# Patient Record
Sex: Female | Born: 1960 | Race: Black or African American | Hispanic: No | Marital: Single | Smoking: Current every day smoker
Health system: Southern US, Community
[De-identification: ages and names within clinical notes are randomized; demographics above are authoritative.]

## PROBLEM LIST (undated history)

## (undated) DIAGNOSIS — R011 Cardiac murmur, unspecified: Secondary | ICD-10-CM

## (undated) DIAGNOSIS — F32A Depression, unspecified: Secondary | ICD-10-CM

## (undated) DIAGNOSIS — M5136 Other intervertebral disc degeneration, lumbar region: Secondary | ICD-10-CM

## (undated) DIAGNOSIS — M51369 Other intervertebral disc degeneration, lumbar region without mention of lumbar back pain or lower extremity pain: Secondary | ICD-10-CM

## (undated) DIAGNOSIS — K259 Gastric ulcer, unspecified as acute or chronic, without hemorrhage or perforation: Secondary | ICD-10-CM

## (undated) DIAGNOSIS — G5601 Carpal tunnel syndrome, right upper limb: Secondary | ICD-10-CM

## (undated) DIAGNOSIS — Z8739 Personal history of other diseases of the musculoskeletal system and connective tissue: Secondary | ICD-10-CM

## (undated) DIAGNOSIS — Z972 Presence of dental prosthetic device (complete) (partial): Secondary | ICD-10-CM

## (undated) DIAGNOSIS — F329 Major depressive disorder, single episode, unspecified: Secondary | ICD-10-CM

## (undated) DIAGNOSIS — M1990 Unspecified osteoarthritis, unspecified site: Secondary | ICD-10-CM

## (undated) DIAGNOSIS — J302 Other seasonal allergic rhinitis: Secondary | ICD-10-CM

## (undated) DIAGNOSIS — F419 Anxiety disorder, unspecified: Secondary | ICD-10-CM

## (undated) DIAGNOSIS — F1911 Other psychoactive substance abuse, in remission: Secondary | ICD-10-CM

## (undated) DIAGNOSIS — K219 Gastro-esophageal reflux disease without esophagitis: Secondary | ICD-10-CM

## (undated) DIAGNOSIS — G40909 Epilepsy, unspecified, not intractable, without status epilepticus: Secondary | ICD-10-CM

## (undated) DIAGNOSIS — Z8719 Personal history of other diseases of the digestive system: Secondary | ICD-10-CM

## (undated) DIAGNOSIS — G589 Mononeuropathy, unspecified: Secondary | ICD-10-CM

## (undated) HISTORY — PX: ABDOMINAL HYSTERECTOMY: SHX81

## (undated) HISTORY — PX: SHOULDER ARTHROSCOPY: SHX128

## (undated) HISTORY — DX: Epilepsy, unspecified, not intractable, without status epilepticus: G40.909

## (undated) HISTORY — PX: EYE SURGERY: SHX253

## (undated) HISTORY — PX: SPINAL FIXATION SURGERY: SHX1055

---

## 1999-09-27 ENCOUNTER — Encounter: Payer: Self-pay | Admitting: Internal Medicine

## 1999-09-27 ENCOUNTER — Ambulatory Visit (HOSPITAL_COMMUNITY): Admission: RE | Admit: 1999-09-27 | Discharge: 1999-09-27 | Payer: Self-pay | Admitting: Internal Medicine

## 2000-10-27 ENCOUNTER — Emergency Department (HOSPITAL_COMMUNITY): Admission: EM | Admit: 2000-10-27 | Discharge: 2000-10-27 | Payer: Self-pay | Admitting: *Deleted

## 2001-01-10 ENCOUNTER — Encounter: Payer: Self-pay | Admitting: Internal Medicine

## 2001-01-10 ENCOUNTER — Encounter: Admission: RE | Admit: 2001-01-10 | Discharge: 2001-01-10 | Payer: Self-pay | Admitting: Internal Medicine

## 2001-11-25 ENCOUNTER — Ambulatory Visit (HOSPITAL_COMMUNITY): Admission: RE | Admit: 2001-11-25 | Discharge: 2001-11-25 | Payer: Self-pay | Admitting: Family Medicine

## 2003-02-20 ENCOUNTER — Ambulatory Visit (HOSPITAL_COMMUNITY): Admission: RE | Admit: 2003-02-20 | Discharge: 2003-02-20 | Payer: Self-pay | Admitting: Internal Medicine

## 2003-02-20 ENCOUNTER — Encounter: Payer: Self-pay | Admitting: Internal Medicine

## 2003-02-27 ENCOUNTER — Ambulatory Visit (HOSPITAL_COMMUNITY): Admission: RE | Admit: 2003-02-27 | Discharge: 2003-02-27 | Payer: Self-pay | Admitting: Family Medicine

## 2004-02-23 ENCOUNTER — Ambulatory Visit (HOSPITAL_COMMUNITY): Admission: RE | Admit: 2004-02-23 | Discharge: 2004-02-23 | Payer: Self-pay | Admitting: Internal Medicine

## 2004-07-27 ENCOUNTER — Ambulatory Visit: Payer: Self-pay | Admitting: *Deleted

## 2004-08-04 ENCOUNTER — Ambulatory Visit: Payer: Self-pay | Admitting: Family Medicine

## 2004-08-31 ENCOUNTER — Ambulatory Visit: Payer: Self-pay | Admitting: Family Medicine

## 2004-09-05 ENCOUNTER — Ambulatory Visit: Payer: Self-pay | Admitting: Family Medicine

## 2006-05-22 ENCOUNTER — Encounter: Admission: RE | Admit: 2006-05-22 | Discharge: 2006-05-22 | Payer: Self-pay | Admitting: Internal Medicine

## 2007-07-25 ENCOUNTER — Emergency Department (HOSPITAL_COMMUNITY): Admission: EM | Admit: 2007-07-25 | Discharge: 2007-07-25 | Payer: Self-pay | Admitting: Emergency Medicine

## 2008-02-21 ENCOUNTER — Ambulatory Visit (HOSPITAL_COMMUNITY): Admission: RE | Admit: 2008-02-21 | Discharge: 2008-02-21 | Payer: Self-pay | Admitting: Internal Medicine

## 2008-11-27 ENCOUNTER — Ambulatory Visit: Admission: RE | Admit: 2008-11-27 | Discharge: 2008-11-27 | Payer: Self-pay | Admitting: Gynecology

## 2008-12-01 ENCOUNTER — Encounter: Payer: Self-pay | Admitting: Obstetrics & Gynecology

## 2008-12-01 ENCOUNTER — Inpatient Hospital Stay (HOSPITAL_COMMUNITY): Admission: RE | Admit: 2008-12-01 | Discharge: 2008-12-03 | Payer: Self-pay | Admitting: Obstetrics & Gynecology

## 2008-12-01 ENCOUNTER — Encounter: Payer: Self-pay | Admitting: Gynecologic Oncology

## 2008-12-01 ENCOUNTER — Ambulatory Visit: Payer: Self-pay | Admitting: Surgery

## 2008-12-01 HISTORY — PX: APPENDECTOMY: SHX54

## 2008-12-01 HISTORY — PX: BILATERAL SALPINGOOPHORECTOMY: SHX1223

## 2009-01-14 ENCOUNTER — Ambulatory Visit: Admission: RE | Admit: 2009-01-14 | Discharge: 2009-01-14 | Payer: Self-pay | Admitting: Gynecologic Oncology

## 2009-03-29 ENCOUNTER — Emergency Department (HOSPITAL_COMMUNITY): Admission: EM | Admit: 2009-03-29 | Discharge: 2009-03-29 | Payer: Self-pay | Admitting: Emergency Medicine

## 2009-05-12 ENCOUNTER — Encounter: Admission: RE | Admit: 2009-05-12 | Discharge: 2009-05-12 | Payer: Self-pay | Admitting: Internal Medicine

## 2009-11-03 ENCOUNTER — Ambulatory Visit (HOSPITAL_COMMUNITY): Admission: RE | Admit: 2009-11-03 | Discharge: 2009-11-03 | Payer: Self-pay | Admitting: Internal Medicine

## 2010-10-13 ENCOUNTER — Other Ambulatory Visit (HOSPITAL_COMMUNITY): Payer: Self-pay | Admitting: Internal Medicine

## 2010-10-13 DIAGNOSIS — Z1231 Encounter for screening mammogram for malignant neoplasm of breast: Secondary | ICD-10-CM

## 2010-11-08 ENCOUNTER — Ambulatory Visit (HOSPITAL_COMMUNITY)
Admission: RE | Admit: 2010-11-08 | Discharge: 2010-11-08 | Disposition: A | Discharge status: Home / Self Care | Payer: BC Managed Care – PPO | Source: Ambulatory Visit | Attending: Internal Medicine | Admitting: Internal Medicine

## 2010-11-08 DIAGNOSIS — Z1231 Encounter for screening mammogram for malignant neoplasm of breast: Secondary | ICD-10-CM

## 2010-12-14 LAB — BASIC METABOLIC PANEL
Chloride: 110 mEq/L (ref 96–112)
GFR calc non Af Amer: >60 mL/min (ref >60)
Sodium: 140 mEq/L (ref 135–145)

## 2010-12-15 LAB — CBC
HCT: 33.2 % — ABNORMAL LOW (ref 36.0–46.0)
HCT: 35.6 % — ABNORMAL LOW (ref 36.0–46.0)
MCHC: 33.5 g/dL (ref 30.0–36.0)
MCHC: 33.9 g/dL (ref 30.0–36.0)
MCV: 99.2 fL (ref 78.0–100.0)
RDW: 14 % (ref 11.5–15.5)
WBC: 5.9 10*3/uL (ref 4.0–10.5)

## 2010-12-15 LAB — BASIC METABOLIC PANEL
BUN: 3 mg/dL — ABNORMAL LOW (ref 6–23)
Calcium: 8 mg/dL — ABNORMAL LOW (ref 8.4–10.5)
GFR calc non Af Amer: >60 mL/min (ref >60)
Sodium: 135 mEq/L (ref 135–145)

## 2010-12-15 LAB — COMPREHENSIVE METABOLIC PANEL
ALT: 12 U/L (ref 0–35)
AST: 15 U/L (ref 0–37)
Albumin: 3.6 g/dL (ref 3.5–5.2)
Alkaline Phosphatase: 64 U/L (ref 39–117)
CO2: 27 mEq/L (ref 19–32)
Calcium: 8.8 mg/dL (ref 8.4–10.5)
GFR calc non Af Amer: >60 mL/min (ref >60)
Glucose, Bld: 95 mg/dL (ref 70–99)
Potassium: 3.7 mEq/L (ref 3.5–5.1)
Sodium: 143 mEq/L (ref 135–145)

## 2010-12-15 LAB — DIFFERENTIAL
Basophils Relative: 1 % (ref 0–1)
Eosinophils Relative: 5 % (ref 0–5)
Lymphocytes Relative: 54 % — ABNORMAL HIGH (ref 12–46)
Lymphs Abs: 1.7 10*3/uL (ref 0.7–4.0)
Monocytes Absolute: 0.2 10*3/uL (ref 0.1–1.0)

## 2011-01-17 NOTE — Op Note (Signed)
NAME:  Pamela [name], Pamela [name] NO.:  [id number]   MEDICAL RECORD NO.:  [id number]          PATIENT TYPE:  INP   LOCATION:  0009                         FACILITY:  [facility]   PHYSICIAN:  Laurette Schimke, MD     DATE OF BIRTH:  21-Jul-1961   DATE OF PROCEDURE:  12/01/2008  DATE OF DISCHARGE:                               OPERATIVE REPORT   PREOPERATIVE DIAGNOSIS:  Pelvic mass.   POSTOPERATIVE DIAGNOSIS:  Right mucinous cystadenoma.   PROCEDURE:  Bilateral salpingo-oophorectomy and appendectomy.   SURGEON:  Laurette Schimke, MD.   ASSISTANT SURGEON:  Roseanna Rainbow, M.D. and Telford Nab,  R.N.   ANESTHESIA:  General endotracheal.   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  A CA-125 returned a value  of 6.1 units.  Other findings were hydronephrosis bilaterally.  The  patient was counseled regarding the need for exploration, bilateral  salpingo-oophorectomy with staging as indicated.   FINDINGS:  Upon entrance into the abdomen a 20 cm adnexal mass, smooth  wall was appreciated.  The contralateral ovary was within normal limits.  The appendix appeared normal.   DESCRIPTION OF PROCEDURE:  The patient was taken to the operating room,  placed under general endotracheal anesthesia without any difficulty.  She was placed in lithotomy position and prepped and draped in the usual  sterile fashion.  A vertical periumbilical incision was made and the  abdominal cavity entered.  Pelvic and abdominal washings were obtained.  A pursestring suture was placed in the presenting portion of the adnexal  mass and a dry towel was placed surrounding.  The cyst was drained of  over 3 L of clear fluid.  The cyst was then delivered into the abdominal  field.  The Bookwalter retractor placed and the contents packed outside  of the pelvis.  The right retroperitoneum space was entered.  The  infundibulopelvic  ligament and ureter were identified.  The  infundibulopelvic ligament was doubly clamped and transected.  The  specimen was dissected free from attachments to the vaginal cuff and  sent for frozen section evaluation.  In a similar manner, the left  retroperitoneum entered.  The ureter was identified, as was the  infundibulopelvic ligament which was clamped and ligated.  The abdomen  was copiously irrigated and drained.  We began closing the incision and  the frozen section returned with diagnosis of mucinous cystadenoma.  As  such it was determined that appendectomy was indicated.  The appendix  appeared normal.  The mesentery of the appendix was clamped and  transfixion suture ligated.  The base of the appendix was resected using  a GIA stapler.  The abdomen was then copiously irrigated and drained and  the fascia closed with #1 loop PDS suture.  The subcutaneous tissues  were copiously irrigated and drained and approximated with interrupted 2-  0 Vicryl.  The skin was closed with subcuticular 4-0 Vicryl suture.   ESTIMATED BLOOD LOSS:  Minimal.   COMPLICATIONS:  None.   SPONGE AND NEEDLE COUNT:  Correct times three.  SPECIMENS:  Ovaries, fallopian tubes and appendix.      Laurette Schimke, MD  Electronically Signed     WB/MEDQ  D:  12/01/2008  T:  12/01/2008  Job:  [id number]   cc:   Janine Limbo, M.D.  Fax: [phone]   Rickard Patience, P.A.  [address]  [place]  [place] [address]   Maretta Bees. Vonita Moss, M.D.  Fax: [phone]   Roseanna Rainbow, M.D.  Fax: [phone]   Telford Nab, R.N.  [address]  [place], [place] [address]

## 2011-01-17 NOTE — Consult Note (Signed)
NAME:  Pamela [name], HELVIE NO.:  [id number]   MEDICAL RECORD NO.:  [id number]          PATIENT TYPE:  OUT   LOCATION:  GYN                          FACILITY:  [facility]   PHYSICIAN:  De Blanch, M.D.DATE OF BIRTH:  01-04-1961   DATE OF CONSULTATION:  11/27/2008  DATE OF DISCHARGE:                                 CONSULTATION   NEW PATIENT CONSULTATION   CHIEF COMPLAINT:  Abdominopelvic mass, urinary frequency.   HISTORY OF PRESENT ILLNESS:  A 50 year old [place] American female seen  for evaluation and management of a large abdominopelvic mass.  The  patient initially presented with urinary frequency, whereupon a large  mass was detected.  A CT scan has been obtained which reveals a 22- x 17-  x 9-cm cystic ovarian neoplasm.  There is mild hydronephrosis  bilaterally secondary to extrinsic compression.  There is no evidence of  ascites or other evidence of metastatic disease.  The patient has had a  CA-125 value which is 6.1 units/mL.  She denies any abdominal pain but  does have pressure and increasing girth.  She also has increasing  gastroesophageal reflux disease.   PAST MEDICAL HISTORY/MEDICAL ILLNESSES:  1. Epilepsy.  (The patient's last seizure was 8 years ago.)  2. Anxiety disorder.  3. Lumbar disk disease.  4. Hiatal hernia.  5. Prior history of drug dependence.  (The patient has taken no drugs      in 15 years.)   CURRENT MEDICATIONS:  1. Effexor 75 mg daily.  2. Tegretol 600 mg b.i.d.  3. Vitamin D.   PAST SURGICAL HISTORY:  1. Hysterectomy for menorrhagia at age 70.  2. Surgery for scoliosis with placement of a Harrington rod.  3. Left shoulder spur.  4. Eye surgery.   DRUG ALLERGIES:  None.   FAMILY HISTORY:  Maternal aunts, 3 of them, with breast cancer, 1  possibly had ovarian cancer.  There is no colon cancer or endometrial  cancer in the family history.   SOCIAL HISTORY:  The patient is single.  She has a partner.  She works  as a Dance movement psychotherapist.  She smokes 1 pack per day for 30 years.  She does  not drink.   OBSTETRIC HISTORY:  Gravida 6 with 2 miscarriages and 4 abortions.   REVIEW OF SYSTEMS:  A 10-point comprehensive review of systems negative  except as noted above.   PHYSICAL EXAMINATION:  Height 5 feet 7, weight 175 pounds.  GENERAL:  The patient is a pleasant African American female in no acute  distress.  HEENT:  Negative.  NECK:  Supple without thyromegaly.  There is no supraclavicular or  inguinal adenopathy.  ABDOMEN:  Soft and non-tender.  There is a soft mass extending slightly  above the umbilicus.  This is nontender.  There is no rebound.  There is  no evidence of ascites.  PELVIC:  EG/BUS, vagina, bladder, urethra are normal.  Cervix and uterus  are surgically absent.  On bimanual exam there is a cystic mass filling  the pelvis and extending to the umbilicus.  This feels cystic and  smooth.  There is no nodularity.  LOWER EXTREMITIES:  Without edema or varicosities.   IMPRESSION:  Large abdominopelvic mass, most likely benign mucinous  cystadenoma.  Patient is symptomatic with a significant amount of pelvic  and abdominal pressure and urinary frequency.  Recommended that she  undergo surgical exploration with removal of the mass.  She would like  to do this as soon as possible and, therefore, we will schedule it to be  performed by my colleague, Dr. Laurette Schimke, next Tuesday.  Patient  understands that if this is benign we would recommend she have the other  ovary removed as well.  On the other hand, if it is malignant then  surgical staging including lymphadenectomy and omentectomy would be  performed.  All the risks were discussed.  Questions were answered.      De Blanch, M.D.  Electronically Signed     DC/MEDQ  D:  11/27/2008  T:  11/27/2008  Job:  [id number]   cc:   Janine Limbo, M.D.  Fax: [phone]   Telford Nab, R.N.  [address]   [place], [place] [address]   Rickard Patience, P.A.  [address]  [place]  [place] [address]   Maretta Bees. Vonita Moss, M.D.  Fax: [phone]

## 2011-01-17 NOTE — Consult Note (Signed)
NAME:  Pamela [name], Pamela [name] NO.:  [id number]   MEDICAL RECORD NO.:  [id number]          PATIENT TYPE:  OUT   LOCATION:  GYN                          [facility]AC[facility]:  [facility]   PHYSICIAN:  Laurette Schimke, MD     DATE OF BIRTH:  1960/12/28   DATE OF CONSULTATION:  01/14/2009  DATE OF DISCHARGE:                                 CONSULTATION   REASON FOR VISIT:  Postoperative evaluation.   HISTORY OF PRESENT ILLNESS:  This is a 50 year old who was noted to have  a pelvic mass which was confirmed by CT scan to be a 22 cm cystic  ovarian neoplasm.  CA125 was within normal limits.  On December 01, 2008,  she underwent bilateral salpingo-oophorectomy and appendectomy.  Pathology was consistent with a mucinous cystadenoma of the right ovary.  The left ovary and fallopian tube were unremarkable.  The appendix was  notable only for evidence of chronic inflammation without any evidence  of malignancy.  Her postoperative course was complicated by a  superficial wound separation.  At this visit, Pamela [name] states that  she has a marked decrease in appetite and a 20 pound weight loss is  appreciated.  She complains of some firmness around the site of the  incision and numbness.  Otherwise, she reports normal bowel movements  and minimal abdominal pain.  Of note, is the unfortunate passing of her  significant other from a myocardial infarction quite unexpectedly.   PHYSICAL EXAMINATION:  VITAL SIGNS:  Weight is 153 pounds, blood  pressure 110/82.  ABDOMEN:  Soft, nontender.  The incision is completely epithelized.  There is firmness at the site of the incision.  No hernia is  appreciated.   IMPRESSION:  Status post bilateral salpingo-oophorectomy and  appendectomy for a right mucinous cystadenoma.  Pamela [name] is doing  well from a post-surgical standpoint.  However emotionally, there is a  significant trauma for which she appears to be coping.   We had a discussion about methods to  increase her caloric intake given  the profound lack of appetite.  She has agreed to follow through.  She  was provided with return to work release with restrictions in heavy  lifting.  No more than 10 pounds for the next 6 weeks.  I have  discharged Pamela [name] back to the care of Henreitta Leber and to [employer].      Laurette Schimke, MD  Electronically Signed     WB/MEDQ  D:  01/14/2009  T:  01/14/2009  Job:  [id number]   cc:   Telford Nab, R.N.  [name]  [place], [place] [address]   Marquis Lunch. Adline Peals.   Velva Harman

## 2011-01-20 NOTE — Discharge Summary (Signed)
NAME:  Pamela [name], Pamela [name] NO.:  [id number]   MEDICAL RECORD NO.:  [id number]          PATIENT TYPE:  INP   LOCATION:  1532                         [facility]ACILITY:  [facility]   PHYSICIAN:  Roseanna Rainbow, M.D.DATE OF BIRTH:  05/01/1961   DATE OF ADMISSION:  12/01/2008  DATE OF DISCHARGE:  12/03/2008                               DISCHARGE SUMMARY   CHIEF COMPLAINT:  The patient is a 50 year old with a large  abdominopelvic mass who presents for further evaluation and management.  Please see the dictated history and physical as per Dr. Katheren Shams-  Sharol Given for further details.   HOSPITAL COURSE:  The patient was admitted and underwent a bilateral  salpingo-oophorectomy and appendectomy.  Please see the dictated  operative summary.  Her postoperative course was uneventful.  Her diet  was gradually advanced.  At the time of discharge, she was tolerating a  regular diet.   DISCHARGE DIAGNOSIS:  Right-sided mucinous cystadenoma.   PROCEDURES:  Bilateral salpingo-oophorectomy and appendectomy.   CONDITION:  Stable.   DIET:  Regular.   ACTIVITY:  Pelvic rest, progressive activity.   MEDICATIONS:  1. Effexor 75 mg p.o. daily.  2. Tegretol 600 mg p.o. b.i.d.  3. Vitamin D.  4. Ultram 1-2 tablets every 6 hours as needed.   DISPOSITION:  The patient was to follow up in the GYN oncology office  for a routine postoperative visit.      Roseanna Rainbow, M.D.  Electronically Signed     LAJ/MEDQ  D:  12/07/2008  T:  12/07/2008  Job:  [id number]   cc:   Janine Limbo, M.D.  Fax: [phone]   Telford Nab, R.N.  [address]  [place], [place] [address]   Rickard Patience, P.A.  [address]  [place]  [place] [address]   Maretta Bees. Vonita Moss, M.D.  Fax: [phone]

## 2011-01-20 NOTE — Consult Note (Signed)
NAME:  Pamela [name], Pamela [name] NO.:  [id number]   MEDICAL RECORD NO.:  [id number]          PATIENT TYPE:  INP   LOCATION:                               FACILITY:  [facility]   PHYSICIAN:  Laurette Schimke, MD     DATE OF BIRTH:  [birth year]   DATE OF CONSULTATION:  12/08/2008  DATE OF DISCHARGE:  12/03/2008                                 CONSULTATION   REASON FOR VISIT:  Postoperative check.   HISTORY OF PRESENT ILLNESS:  This a 50 year old who on CT scan was noted  to have a 22-cm cystic ovarian neoplasm.  Normal CA 125.  On December 01, 2008, she underwent a bilateral salpingo-oophorectomy and appendectomy.  Final pathology was consistent with a right mucinous cystadenoma, a  normal left ovary and fallopian tube, and appendix with mild and chronic  inflammation without evidence of malignancy.  Pamela [name] did well  postoperatively; however, she reports that yesterday she has had an  onset of discharge from the incision.   PHYSICAL EXAMINATION:  ABDOMEN:  Soft and nontender.  There is a  superior and inferior aspect of the incision that is open.  The fascia  was probed with a Q-tip and was noted to be intact.  No fluid pocket or  abscess collection could be appreciated.  Gauze was placed over the  incision.   Pamela [name] has been advised to open the area when she takes a shower.  Otherwise, she has been advised to apply Neosporin and place a 4 x 4  over the openings.  I have asked her to follow up in 2 weeks.      Laurette Schimke, MD  Electronically Signed     WB/MEDQ  D:  12/08/2008  T:  12/08/2008  Job:  [id number]   cc:   Telford Nab, R.N.  [address]  [place], [place] [address]   Janine Limbo, M.D.  Fax: [phone]

## 2011-10-17 ENCOUNTER — Other Ambulatory Visit (HOSPITAL_COMMUNITY): Payer: Self-pay | Admitting: Nurse Practitioner

## 2011-10-17 DIAGNOSIS — Z1231 Encounter for screening mammogram for malignant neoplasm of breast: Secondary | ICD-10-CM

## 2011-11-09 ENCOUNTER — Ambulatory Visit (HOSPITAL_COMMUNITY)
Admission: RE | Admit: 2011-11-09 | Discharge: 2011-11-09 | Disposition: A | Discharge status: Home / Self Care | Payer: BC Managed Care – PPO | Source: Ambulatory Visit | Attending: Nurse Practitioner | Admitting: Nurse Practitioner

## 2011-11-09 DIAGNOSIS — Z1231 Encounter for screening mammogram for malignant neoplasm of breast: Secondary | ICD-10-CM | POA: Insufficient documentation

## 2012-04-16 ENCOUNTER — Telehealth: Payer: Self-pay | Admitting: Hematology and Oncology

## 2012-04-16 NOTE — Telephone Encounter (Signed)
S/w pt re appt for 8/23 @ 9:30 am.

## 2012-04-17 ENCOUNTER — Telehealth: Payer: Self-pay | Admitting: Hematology and Oncology

## 2012-04-17 NOTE — Telephone Encounter (Signed)
Referred by Bebe Liter, FNP Dx- Leukopenia. NP packet mailed out.

## 2012-04-19 ENCOUNTER — Telehealth: Payer: Self-pay | Admitting: Hematology and Oncology

## 2012-04-19 NOTE — Telephone Encounter (Signed)
Delivered chart on 8/16 for a 8/23 appt with Dr. Dalene Carrow.

## 2012-04-26 ENCOUNTER — Ambulatory Visit: Payer: BC Managed Care – PPO | Admitting: Hematology and Oncology

## 2012-04-26 ENCOUNTER — Ambulatory Visit: Payer: BC Managed Care – PPO

## 2012-06-28 ENCOUNTER — Other Ambulatory Visit: Payer: Self-pay | Admitting: Nurse Practitioner

## 2012-06-28 ENCOUNTER — Ambulatory Visit
Admission: RE | Admit: 2012-06-28 | Discharge: 2012-06-28 | Disposition: A | Discharge status: Home / Self Care | Payer: BC Managed Care – PPO | Source: Ambulatory Visit | Attending: Nurse Practitioner | Admitting: Nurse Practitioner

## 2012-06-28 DIAGNOSIS — M79671 Pain in right foot: Secondary | ICD-10-CM

## 2012-10-26 ENCOUNTER — Encounter (HOSPITAL_COMMUNITY): Payer: Self-pay | Admitting: Emergency Medicine

## 2012-10-26 ENCOUNTER — Emergency Department (HOSPITAL_COMMUNITY)
Admission: EM | Admit: 2012-10-26 | Discharge: 2012-10-26 | Disposition: A | Discharge status: Home / Self Care | Payer: BC Managed Care – PPO | Attending: Emergency Medicine | Admitting: Emergency Medicine

## 2012-10-26 ENCOUNTER — Emergency Department (HOSPITAL_COMMUNITY): Payer: BC Managed Care – PPO

## 2012-10-26 DIAGNOSIS — J029 Acute pharyngitis, unspecified: Secondary | ICD-10-CM | POA: Insufficient documentation

## 2012-10-26 DIAGNOSIS — T1490XA Injury, unspecified, initial encounter: Secondary | ICD-10-CM | POA: Insufficient documentation

## 2012-10-26 DIAGNOSIS — Z87891 Personal history of nicotine dependence: Secondary | ICD-10-CM | POA: Insufficient documentation

## 2012-10-26 DIAGNOSIS — Z79899 Other long term (current) drug therapy: Secondary | ICD-10-CM | POA: Insufficient documentation

## 2012-10-26 DIAGNOSIS — R059 Cough, unspecified: Secondary | ICD-10-CM | POA: Insufficient documentation

## 2012-10-26 DIAGNOSIS — R631 Polydipsia: Secondary | ICD-10-CM | POA: Insufficient documentation

## 2012-10-26 DIAGNOSIS — R55 Syncope and collapse: Secondary | ICD-10-CM | POA: Insufficient documentation

## 2012-10-26 DIAGNOSIS — R509 Fever, unspecified: Secondary | ICD-10-CM | POA: Insufficient documentation

## 2012-10-26 DIAGNOSIS — Y929 Unspecified place or not applicable: Secondary | ICD-10-CM | POA: Insufficient documentation

## 2012-10-26 DIAGNOSIS — R296 Repeated falls: Secondary | ICD-10-CM | POA: Insufficient documentation

## 2012-10-26 DIAGNOSIS — Y939 Activity, unspecified: Secondary | ICD-10-CM | POA: Insufficient documentation

## 2012-10-26 DIAGNOSIS — B9789 Other viral agents as the cause of diseases classified elsewhere: Secondary | ICD-10-CM | POA: Insufficient documentation

## 2012-10-26 DIAGNOSIS — R51 Headache: Secondary | ICD-10-CM | POA: Insufficient documentation

## 2012-10-26 DIAGNOSIS — R5381 Other malaise: Secondary | ICD-10-CM | POA: Insufficient documentation

## 2012-10-26 DIAGNOSIS — IMO0001 Reserved for inherently not codable concepts without codable children: Secondary | ICD-10-CM | POA: Insufficient documentation

## 2012-10-26 LAB — BASIC METABOLIC PANEL
BUN: 9 mg/dL (ref 6–23)
Calcium: 8.7 mg/dL (ref 8.4–10.5)
Creatinine, Ser: 0.73 mg/dL (ref 0.50–1.10)
GFR calc non Af Amer: >90 mL/min (ref >90)
Glucose, Bld: 94 mg/dL (ref 70–99)
Sodium: 138 mEq/L (ref 135–145)

## 2012-10-26 LAB — DIFFERENTIAL
Lymphocytes Relative: 36 % (ref 12–46)
Lymphs Abs: 2.2 10*3/uL (ref 0.7–4.0)
Monocytes Absolute: 0.6 10*3/uL (ref 0.1–1.0)
Monocytes Relative: 10 % (ref 3–12)
Neutro Abs: 3 10*3/uL (ref 1.7–7.7)

## 2012-10-26 LAB — HEPATIC FUNCTION PANEL
ALT: 10 U/L (ref 0–35)
AST: 14 U/L (ref 0–37)
Albumin: 3.2 g/dL — ABNORMAL LOW (ref 3.5–5.2)
Bilirubin, Direct: <0.1 mg/dL (ref 0.0–0.3)
Total Bilirubin: 0.3 mg/dL (ref 0.3–1.2)

## 2012-10-26 LAB — CBC
Hemoglobin: 11.6 g/dL — ABNORMAL LOW (ref 12.0–15.0)
MCH: 32.4 pg (ref 26.0–34.0)
MCHC: 34.3 g/dL (ref 30.0–36.0)
MCV: 94.4 fL (ref 78.0–100.0)

## 2012-10-26 LAB — URINE MICROSCOPIC-ADD ON

## 2012-10-26 LAB — URINALYSIS, ROUTINE W REFLEX MICROSCOPIC
Bilirubin Urine: NEGATIVE
Glucose, UA: NEGATIVE mg/dL
Hgb urine dipstick: NEGATIVE
Ketones, ur: NEGATIVE mg/dL
pH: 6 (ref 5.0–8.0)

## 2012-10-26 LAB — CARBAMAZEPINE LEVEL, TOTAL: Carbamazepine Lvl: 14.3 ug/mL — ABNORMAL HIGH (ref 4.0–12.0)

## 2012-10-26 MED ORDER — SODIUM CHLORIDE 0.9 % IV BOLUS (SEPSIS)
2000.0000 mL | Freq: Once | INTRAVENOUS | Status: AC
  Administered 2012-10-26 (×2): 1000 mL via INTRAVENOUS

## 2012-10-26 NOTE — ED Notes (Signed)
Pt states that she passed out twice yesterday.  [facility] 80/50.  They sent her here.  Denies passing out today.  Denies any episodes of this happening today.  Pt has been having a productive cough, loss of appetite x 2 days.  Just finished abx for a sinus infection.

## 2012-10-26 NOTE — ED Provider Notes (Signed)
History     CSN: [id number]  Arrival date & time 10/26/12  1345   First MD Initiated Contact with Patient 10/26/12 1458      Chief Complaint  Patient presents with  . Loss of Consciousness   chief complaint weakness  (Consider location/radiation/quality/duration/timing/severity/associated sxs/prior treatment) HPI  patient reports she had 2 syncopal events yesterday. She was mildly lightheaded before she fell. She admits to diminished appetite for the past therefore days. Feels thirsty presently. Lightheadedness or worse with standing she's also had a cough and sore throat for the past several days, other symptoms include subjective fever and diffuse soreness worse since the fall. She also reports a headache for one month, unchanged. Treated with Tylenol with partial relief of soreness. Nothing makes symptoms better or worse she feels slightly improved today over yesterday History reviewed. No pertinent past medical history. Past medical history seizure disorder anxiety depression, scoliosis, drug abuse History reviewed. No pertinent past surgical history. Surgical history back surgery, hysterectomy History reviewed. No pertinent family history.  History  Substance Use Topics  . Smoking status: Former Games developer  . Smokeless tobacco: Not on file  . Alcohol Use: No   social history ex-smoker quit 3.5 years ago no alcohol in 19 years no illegal drugs in 19 years  OB History   Grav Para Term Preterm Abortions TAB SAB Ect Mult Living                  Review of Systems  HENT: Positive for sore throat.   Respiratory: Positive for cough.   Musculoskeletal: Positive for myalgias.  Neurological: Positive for weakness and headaches.  All other systems reviewed and are negative.    Allergies  Review of patient's allergies indicates no known allergies.  Home Medications   Current Outpatient Rx  Name  Route  Sig  Dispense  Refill  . carbamazepine (TEGRETOL) 100 MG chewable tablet  Oral   Chew 300 mg by mouth 2 (two) times daily.         . magnesium oxide (MAG-OX) 400 MG tablet   Oral   Take 400 mg by mouth daily.         . montelukast (SINGULAIR) 10 MG tablet   Oral   Take 10 mg by mouth at bedtime.         . Multiple Vitamin (MULTIVITAMIN WITH MINERALS) TABS   Oral   Take 1 tablet by mouth daily.         . traZODone (DESYREL) 150 MG tablet   Oral   Take 75 mg by mouth 2 (two) times daily.         Marland Kitchen venlafaxine XR (EFFEXOR-XR) 75 MG 24 hr capsule   Oral   Take 75 mg by mouth every morning.           BP 94/57  Pulse 62  Temp(Src) 98.9 F (37.2 C) (Oral)  Resp 18  SpO2 99%  Physical Exam  Nursing note and vitals reviewed. Constitutional: She is oriented to person, place, and time. She appears well-developed and well-nourished.  HENT:  Head: Normocephalic and atraumatic.  Right Ear: External ear normal.  Left Ear: External ear normal.  Mucous membranes dry oropharynx reddened uvula midline no tonsillar exudate  Eyes: Conjunctivae are normal. Pupils are equal, round, and reactive to light.  Neck: Neck supple. No tracheal deviation present. No thyromegaly present.  Cardiovascular: Normal rate and regular rhythm.   No murmur heard. Pulmonary/Chest: Effort normal and breath sounds normal.  Abdominal: Soft. Bowel sounds are normal. She exhibits no distension. There is no tenderness.  Musculoskeletal: Normal range of motion. She exhibits no edema and no tenderness.  Neurological: She is alert and oriented to person, place, and time. She has normal reflexes. Coordination normal.  Gait normal mildly lightheaded on standing  Skin: Skin is warm and dry. No rash noted.  Psychiatric: She has a normal mood and affect.    ED Course  Procedures (including critical care time)  Labs Reviewed  CBC - Abnormal; Notable for the following:    RBC 3.58 (*)    Hemoglobin 11.6 (*)    HCT 33.8 (*)    Platelets 128 (*)    All other components within  normal limits  GLUCOSE, CAPILLARY  BASIC METABOLIC PANEL   No results found.   No diagnosis found.   Date: 10/26/2012  Rate: 65  Rhythm: normal sinus rhythm  QRS Axis: normal  Intervals: normal  ST/T Wave abnormalities: normal  Conduction Disutrbances: none  Narrative Interpretation: unremarkable 8:15 PM patient feels improved after intravenous hydration and oral hydration. 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 Assessment I feel the patient had syncopal event likely secondary to mild dehydration as she is lightheaded when she stood up and not lightheaded after intravenous hydration. She's also diminished appetite for the past few days. Doubt seizure subtherapeutic Tegretol level Plan Tylenol for pain encourage by mouth intake. Followup Dr. Renae Gloss if not improving in 2 or 3 days Diagnosis #1 syncope #2 dehydration 3 viral illness #4 thrombocytopenia        Doug Sou, MD 10/26/12 2021

## 2013-06-18 ENCOUNTER — Other Ambulatory Visit: Payer: Self-pay

## 2013-06-18 ENCOUNTER — Other Ambulatory Visit: Payer: Self-pay | Admitting: Family

## 2013-06-18 DIAGNOSIS — Z1231 Encounter for screening mammogram for malignant neoplasm of breast: Secondary | ICD-10-CM

## 2013-06-18 DIAGNOSIS — Z803 Family history of malignant neoplasm of breast: Secondary | ICD-10-CM

## 2013-07-15 ENCOUNTER — Ambulatory Visit: Payer: BC Managed Care – PPO

## 2013-08-26 ENCOUNTER — Ambulatory Visit
Admission: RE | Admit: 2013-08-26 | Discharge: 2013-08-26 | Disposition: A | Discharge status: Home / Self Care | Payer: No Typology Code available for payment source | Source: Ambulatory Visit

## 2013-08-26 DIAGNOSIS — Z803 Family history of malignant neoplasm of breast: Secondary | ICD-10-CM

## 2013-08-26 DIAGNOSIS — Z1231 Encounter for screening mammogram for malignant neoplasm of breast: Secondary | ICD-10-CM

## 2013-11-04 ENCOUNTER — Ambulatory Visit
Admission: RE | Admit: 2013-11-04 | Discharge: 2013-11-04 | Disposition: A | Discharge status: Home / Self Care | Payer: No Typology Code available for payment source | Source: Ambulatory Visit | Attending: Family | Admitting: Family

## 2013-11-04 ENCOUNTER — Other Ambulatory Visit: Payer: Self-pay | Admitting: Family

## 2013-11-04 DIAGNOSIS — M79606 Pain in leg, unspecified: Secondary | ICD-10-CM

## 2013-11-26 ENCOUNTER — Ambulatory Visit: Payer: No Typology Code available for payment source | Admitting: Physical Therapy

## 2013-11-27 ENCOUNTER — Ambulatory Visit
Payer: No Typology Code available for payment source | Attending: Obstetrics and Gynecology | Admitting: Physical Therapy

## 2013-11-27 DIAGNOSIS — M545 Low back pain, unspecified: Secondary | ICD-10-CM | POA: Insufficient documentation

## 2013-11-27 DIAGNOSIS — M25569 Pain in unspecified knee: Secondary | ICD-10-CM | POA: Insufficient documentation

## 2013-11-27 DIAGNOSIS — IMO0001 Reserved for inherently not codable concepts without codable children: Secondary | ICD-10-CM | POA: Insufficient documentation

## 2013-11-27 DIAGNOSIS — R262 Difficulty in walking, not elsewhere classified: Secondary | ICD-10-CM | POA: Insufficient documentation

## 2013-11-27 DIAGNOSIS — R269 Unspecified abnormalities of gait and mobility: Secondary | ICD-10-CM | POA: Insufficient documentation

## 2013-11-27 DIAGNOSIS — R293 Abnormal posture: Secondary | ICD-10-CM | POA: Insufficient documentation

## 2013-11-27 DIAGNOSIS — R5381 Other malaise: Secondary | ICD-10-CM | POA: Insufficient documentation

## 2013-12-03 ENCOUNTER — Ambulatory Visit: Payer: No Typology Code available for payment source | Attending: Obstetrics and Gynecology | Admitting: Rehabilitation

## 2013-12-03 DIAGNOSIS — IMO0001 Reserved for inherently not codable concepts without codable children: Secondary | ICD-10-CM | POA: Insufficient documentation

## 2013-12-03 DIAGNOSIS — R293 Abnormal posture: Secondary | ICD-10-CM | POA: Insufficient documentation

## 2013-12-03 DIAGNOSIS — R269 Unspecified abnormalities of gait and mobility: Secondary | ICD-10-CM | POA: Insufficient documentation

## 2013-12-03 DIAGNOSIS — M545 Low back pain, unspecified: Secondary | ICD-10-CM | POA: Insufficient documentation

## 2013-12-03 DIAGNOSIS — R262 Difficulty in walking, not elsewhere classified: Secondary | ICD-10-CM | POA: Insufficient documentation

## 2013-12-03 DIAGNOSIS — M25569 Pain in unspecified knee: Secondary | ICD-10-CM | POA: Insufficient documentation

## 2013-12-03 DIAGNOSIS — R5381 Other malaise: Secondary | ICD-10-CM | POA: Insufficient documentation

## 2013-12-04 ENCOUNTER — Encounter: Payer: No Typology Code available for payment source | Admitting: Rehabilitation

## 2013-12-04 ENCOUNTER — Ambulatory Visit: Payer: No Typology Code available for payment source | Admitting: Rehabilitation

## 2013-12-11 ENCOUNTER — Encounter: Payer: No Typology Code available for payment source | Admitting: Physical Therapy

## 2013-12-15 ENCOUNTER — Encounter: Payer: No Typology Code available for payment source | Admitting: Rehabilitation

## 2013-12-15 ENCOUNTER — Encounter: Payer: Self-pay | Admitting: Physical Medicine & Rehabilitation

## 2013-12-17 ENCOUNTER — Encounter: Payer: No Typology Code available for payment source | Admitting: Rehabilitation

## 2014-01-14 ENCOUNTER — Ambulatory Visit: Payer: No Typology Code available for payment source

## 2014-01-27 ENCOUNTER — Ambulatory Visit: Payer: No Typology Code available for payment source | Admitting: Physical Medicine & Rehabilitation

## 2014-01-28 ENCOUNTER — Ambulatory Visit: Payer: No Typology Code available for payment source | Attending: Obstetrics and Gynecology

## 2014-01-28 DIAGNOSIS — R5381 Other malaise: Secondary | ICD-10-CM | POA: Insufficient documentation

## 2014-01-28 DIAGNOSIS — IMO0001 Reserved for inherently not codable concepts without codable children: Secondary | ICD-10-CM | POA: Insufficient documentation

## 2014-01-28 DIAGNOSIS — M25569 Pain in unspecified knee: Secondary | ICD-10-CM | POA: Insufficient documentation

## 2014-01-28 DIAGNOSIS — M545 Low back pain, unspecified: Secondary | ICD-10-CM | POA: Insufficient documentation

## 2014-01-28 DIAGNOSIS — R269 Unspecified abnormalities of gait and mobility: Secondary | ICD-10-CM | POA: Insufficient documentation

## 2014-01-28 DIAGNOSIS — R293 Abnormal posture: Secondary | ICD-10-CM | POA: Insufficient documentation

## 2014-01-28 DIAGNOSIS — R262 Difficulty in walking, not elsewhere classified: Secondary | ICD-10-CM | POA: Insufficient documentation

## 2014-02-06 ENCOUNTER — Ambulatory Visit: Payer: No Typology Code available for payment source | Attending: Obstetrics and Gynecology

## 2014-02-06 DIAGNOSIS — R5381 Other malaise: Secondary | ICD-10-CM | POA: Insufficient documentation

## 2014-02-06 DIAGNOSIS — M545 Low back pain, unspecified: Secondary | ICD-10-CM | POA: Insufficient documentation

## 2014-02-06 DIAGNOSIS — IMO0001 Reserved for inherently not codable concepts without codable children: Secondary | ICD-10-CM | POA: Insufficient documentation

## 2014-02-06 DIAGNOSIS — R293 Abnormal posture: Secondary | ICD-10-CM | POA: Insufficient documentation

## 2014-02-06 DIAGNOSIS — M25569 Pain in unspecified knee: Secondary | ICD-10-CM | POA: Insufficient documentation

## 2014-02-06 DIAGNOSIS — R269 Unspecified abnormalities of gait and mobility: Secondary | ICD-10-CM | POA: Insufficient documentation

## 2014-02-06 DIAGNOSIS — R262 Difficulty in walking, not elsewhere classified: Secondary | ICD-10-CM | POA: Insufficient documentation

## 2014-02-10 ENCOUNTER — Ambulatory Visit: Payer: No Typology Code available for payment source

## 2014-02-12 ENCOUNTER — Ambulatory Visit: Payer: No Typology Code available for payment source

## 2014-02-17 ENCOUNTER — Ambulatory Visit: Payer: No Typology Code available for payment source

## 2014-02-19 ENCOUNTER — Ambulatory Visit: Payer: No Typology Code available for payment source

## 2014-03-03 ENCOUNTER — Ambulatory Visit: Payer: No Typology Code available for payment source

## 2014-03-05 ENCOUNTER — Ambulatory Visit: Payer: No Typology Code available for payment source | Attending: Obstetrics and Gynecology

## 2014-03-05 DIAGNOSIS — R269 Unspecified abnormalities of gait and mobility: Secondary | ICD-10-CM | POA: Insufficient documentation

## 2014-03-05 DIAGNOSIS — M545 Low back pain, unspecified: Secondary | ICD-10-CM | POA: Insufficient documentation

## 2014-03-05 DIAGNOSIS — R262 Difficulty in walking, not elsewhere classified: Secondary | ICD-10-CM | POA: Insufficient documentation

## 2014-03-05 DIAGNOSIS — M25569 Pain in unspecified knee: Secondary | ICD-10-CM | POA: Insufficient documentation

## 2014-03-05 DIAGNOSIS — R293 Abnormal posture: Secondary | ICD-10-CM | POA: Insufficient documentation

## 2014-03-05 DIAGNOSIS — R5381 Other malaise: Secondary | ICD-10-CM | POA: Insufficient documentation

## 2014-03-05 DIAGNOSIS — IMO0001 Reserved for inherently not codable concepts without codable children: Secondary | ICD-10-CM | POA: Insufficient documentation

## 2014-07-14 ENCOUNTER — Other Ambulatory Visit: Payer: Self-pay | Admitting: Orthopedic Surgery

## 2014-07-21 NOTE — Pre-Procedure Instructions (Signed)
Pamela [name]  07/21/2014   Your procedure is scheduled on:  Wednesday, Nov. 25th   [facility] Admitting at  6:30 AM   Call this number if you have problems the morning of surgery: [phone]   Remember:   Do not eat food or drink liquids after midnight Tuesday.   Take these medicines the morning of surgery with A SIP OF WATER:  Tegretol, Tramadol, Effexor   Do not wear jewelry, make-up or nail polish.  Do not wear lotions, powders, or perfumes. You may NOT wear deodorant the morning of surgery.  Do not shave underarms & legs 48 hours prior to surgery.   Do not bring valuables to the hospital.  [facility] is not responsible for any belongings or valuables.               Contacts, dentures or bridgework may not be worn into surgery.  Leave suitcase in the car. After surgery it may be brought to your room.  For patients admitted to the hospital, discharge time is determined by your treatment team.    Name and phone number of your driver:   Special Instructions: "Preparing for Surgery" instruction sheet.   Please read over the following fact sheets that you were given: Pain Booklet, Coughing and Deep Breathing, Blood Transfusion Information, MRSA Information and Surgical Site Infection Prevention

## 2014-07-22 ENCOUNTER — Encounter (HOSPITAL_COMMUNITY): Payer: Self-pay

## 2014-07-22 ENCOUNTER — Encounter (HOSPITAL_COMMUNITY)
Admission: RE | Admit: 2014-07-22 | Discharge: 2014-07-22 | Disposition: A | Discharge status: Home / Self Care | Payer: No Typology Code available for payment source | Source: Ambulatory Visit | Attending: Orthopedic Surgery | Admitting: Orthopedic Surgery

## 2014-07-22 DIAGNOSIS — Z01818 Encounter for other preprocedural examination: Secondary | ICD-10-CM | POA: Diagnosis present

## 2014-07-22 DIAGNOSIS — K449 Diaphragmatic hernia without obstruction or gangrene: Secondary | ICD-10-CM | POA: Insufficient documentation

## 2014-07-22 DIAGNOSIS — K219 Gastro-esophageal reflux disease without esophagitis: Secondary | ICD-10-CM | POA: Insufficient documentation

## 2014-07-22 DIAGNOSIS — M419 Scoliosis, unspecified: Secondary | ICD-10-CM | POA: Insufficient documentation

## 2014-07-22 DIAGNOSIS — Z981 Arthrodesis status: Secondary | ICD-10-CM | POA: Diagnosis not present

## 2014-07-22 HISTORY — DX: Depression, unspecified: F32.A

## 2014-07-22 HISTORY — DX: Gastro-esophageal reflux disease without esophagitis: K21.9

## 2014-07-22 HISTORY — DX: Major depressive disorder, single episode, unspecified: F32.9

## 2014-07-22 HISTORY — DX: Anxiety disorder, unspecified: F41.9

## 2014-07-22 HISTORY — DX: Personal history of other diseases of the digestive system: Z87.19

## 2014-07-22 HISTORY — DX: Cardiac murmur, unspecified: R01.1

## 2014-07-22 LAB — COMPREHENSIVE METABOLIC PANEL
ALBUMIN: 3.5 g/dL (ref 3.5–5.2)
ALK PHOS: 75 U/L (ref 39–117)
ALT: 10 U/L (ref 0–35)
ANION GAP: 11 (ref 5–15)
AST: 14 U/L (ref 0–37)
BUN: 11 mg/dL (ref 6–23)
CO2: 26 mEq/L (ref 19–32)
Calcium: 9.1 mg/dL (ref 8.4–10.5)
Chloride: 107 mEq/L (ref 96–112)
Creatinine, Ser: 0.68 mg/dL (ref 0.50–1.10)
GFR calc Af Amer: >90 mL/min (ref >90)
GFR calc non Af Amer: >90 mL/min (ref >90)
Glucose, Bld: 82 mg/dL (ref 70–99)
Potassium: 4.1 mEq/L (ref 3.7–5.3)
SODIUM: 144 meq/L (ref 137–147)
Total Bilirubin: <0.2 mg/dL — ABNORMAL LOW (ref 0.3–1.2)
Total Protein: 6.4 g/dL (ref 6.0–8.3)

## 2014-07-22 LAB — CBC WITH DIFFERENTIAL/PLATELET
Basophils Absolute: 0 10*3/uL (ref 0.0–0.1)
Basophils Relative: 1 % (ref 0–1)
Eosinophils Absolute: 0.3 10*3/uL (ref 0.0–0.7)
Eosinophils Relative: 8 % — ABNORMAL HIGH (ref 0–5)
HCT: 34.7 % — ABNORMAL LOW (ref 36.0–46.0)
Hemoglobin: 11.5 g/dL — ABNORMAL LOW (ref 12.0–15.0)
Lymphocytes Relative: 53 % — ABNORMAL HIGH (ref 12–46)
Lymphs Abs: 1.6 10*3/uL (ref 0.7–4.0)
MCH: 31.6 pg (ref 26.0–34.0)
MCHC: 33.1 g/dL (ref 30.0–36.0)
MCV: 95.3 fL (ref 78.0–100.0)
MONO ABS: 0.3 10*3/uL (ref 0.1–1.0)
Monocytes Relative: 8 % (ref 3–12)
NEUTROS PCT: 30 % — AB (ref 43–77)
Neutro Abs: 1 10*3/uL — ABNORMAL LOW (ref 1.7–7.7)
PLATELETS: 169 10*3/uL (ref 150–400)
RBC: 3.64 MIL/uL — ABNORMAL LOW (ref 3.87–5.11)
RDW: 13.7 % (ref 11.5–15.5)
SMEAR REVIEW: ADEQUATE
WBC: 3.2 10*3/uL — ABNORMAL LOW (ref 4.0–10.5)

## 2014-07-22 LAB — URINALYSIS, ROUTINE W REFLEX MICROSCOPIC
Bilirubin Urine: NEGATIVE
GLUCOSE, UA: NEGATIVE mg/dL
HGB URINE DIPSTICK: NEGATIVE
Ketones, ur: NEGATIVE mg/dL
Nitrite: NEGATIVE
PROTEIN: NEGATIVE mg/dL
SPECIFIC GRAVITY, URINE: 1.02 (ref 1.005–1.030)
Urobilinogen, UA: 0.2 mg/dL (ref 0.0–1.0)
pH: 6.5 (ref 5.0–8.0)

## 2014-07-22 LAB — SURGICAL PCR SCREEN
MRSA, PCR: NEGATIVE
STAPHYLOCOCCUS AUREUS: NEGATIVE

## 2014-07-22 LAB — PROTIME-INR
INR: 0.99 (ref 0.00–1.49)
Prothrombin Time: 13.2 seconds (ref 11.6–15.2)

## 2014-07-22 LAB — URINE MICROSCOPIC-ADD ON

## 2014-07-22 LAB — APTT: aPTT: 31 seconds (ref 24–37)

## 2014-07-22 NOTE — Pre-Procedure Instructions (Signed)
Pamela [name]  07/22/2014   Your procedure is scheduled on:  Wednesday, July 29, 2014  [facility] Entrance "A" [address] at [employer]6:30 AM.  Call this number if you have problems the morning of surgery: [phone]   Remember:   Do not eat food or drink liquids after midnight.   Take these medicines the morning of surgery with A SIP OF WATER: TYLENOL (IF NEEDED), TEGRETOL, ULTRAM, TRAZODONE (DYSYREL), VENLAFAXINE (EFFEXOR-XR)  STOP taking Aspirin, Goody's, BC's, Aleve (Naproxen), Ibuprofen (Advil or Motrin), Fish Oil, Vitamins, Herbal Supplements or any substance that could thin your blood starting today, Wednesday, July 22, 2014.  Continue to take all your other medicines as you normally do until day of surgery and then follow above instructions.   Do not wear jewelry, make-up or nail polish.  Do not wear lotions, powders, or perfumes. You may wear deodorant.  Do not shave 48 hours prior to surgery.   Do not bring valuables to the hospital.  [facility] is not responsible                  for any belongings or valuables.               Contacts, dentures or bridgework may not be worn into surgery.  Leave suitcase in the car. After surgery it may be brought to your room.  For patients admitted to the hospital, discharge time is determined by your                treatment team.               Patients discharged the day of surgery will not be allowed to drive  home.    Special Instructions: Please use CHG soap the night before surgery and the day of surgery. CHG soap should be used atleast twice.   Please read over the following fact sheets that you were given: Pain Booklet, Coughing and Deep Breathing, Blood Transfusion Information, MRSA Information and Surgical Site Infection Prevention

## 2014-07-22 NOTE — Progress Notes (Signed)
Per Blood Bank, a mismatch of blood bracelets occurred and sample has to be recollected on day of surgery.

## 2014-07-22 NOTE — Progress Notes (Signed)
Pt reports last office visit was about 2 weeks ago (saw [place], [place] with Dr. Allyne GeeSanders) at [facility] for a cold and cough. Pt denied fever. Pt reports that she completed oral antibiotics and denies cough or cold symptoms at present. Pt denied having blood drawn or CXR at PCP office.  Pt denies seeing a Cardiologist and reports that ECHO and stress test were done over 20 years ago. Pt reports last chest pain was about 5 years ago when she was diagnosed with GERD and having a hiatal hernia.

## 2014-07-24 LAB — PATHOLOGIST SMEAR REVIEW

## 2014-07-28 MED ORDER — POVIDONE-IODINE 7.5 % EX SOLN
Freq: Once | CUTANEOUS | Status: DC
  Filled 2014-07-28: qty 118

## 2014-07-28 MED ORDER — CEFAZOLIN SODIUM-DEXTROSE 2-3 GM-% IV SOLR
2.0000 g | INTRAVENOUS | Status: AC
  Administered 2014-07-29: 2 g via INTRAVENOUS
  Filled 2014-07-28: qty 50

## 2014-07-29 ENCOUNTER — Ambulatory Visit (HOSPITAL_COMMUNITY): Payer: No Typology Code available for payment source | Admitting: Certified Registered Nurse Anesthetist

## 2014-07-29 ENCOUNTER — Encounter (HOSPITAL_COMMUNITY)
Admission: RE | Disposition: A | Discharge status: Home / Self Care | Payer: Self-pay | Source: Ambulatory Visit | Attending: Orthopedic Surgery

## 2014-07-29 ENCOUNTER — Ambulatory Visit (HOSPITAL_COMMUNITY)
Admission: RE | Admit: 2014-07-29 | Discharge: 2014-07-29 | Disposition: A | Discharge status: Home / Self Care | Payer: No Typology Code available for payment source | Source: Ambulatory Visit | Attending: Orthopedic Surgery | Admitting: Orthopedic Surgery

## 2014-07-29 ENCOUNTER — Ambulatory Visit (HOSPITAL_COMMUNITY): Payer: No Typology Code available for payment source

## 2014-07-29 ENCOUNTER — Encounter (HOSPITAL_COMMUNITY): Payer: Self-pay | Admitting: *Deleted

## 2014-07-29 DIAGNOSIS — Z87891 Personal history of nicotine dependence: Secondary | ICD-10-CM | POA: Insufficient documentation

## 2014-07-29 DIAGNOSIS — M79604 Pain in right leg: Secondary | ICD-10-CM | POA: Diagnosis present

## 2014-07-29 DIAGNOSIS — K219 Gastro-esophageal reflux disease without esophagitis: Secondary | ICD-10-CM | POA: Insufficient documentation

## 2014-07-29 DIAGNOSIS — G40909 Epilepsy, unspecified, not intractable, without status epilepticus: Secondary | ICD-10-CM | POA: Diagnosis not present

## 2014-07-29 DIAGNOSIS — Z419 Encounter for procedure for purposes other than remedying health state, unspecified: Secondary | ICD-10-CM

## 2014-07-29 DIAGNOSIS — Z79891 Long term (current) use of opiate analgesic: Secondary | ICD-10-CM | POA: Insufficient documentation

## 2014-07-29 DIAGNOSIS — F419 Anxiety disorder, unspecified: Secondary | ICD-10-CM | POA: Insufficient documentation

## 2014-07-29 DIAGNOSIS — F329 Major depressive disorder, single episode, unspecified: Secondary | ICD-10-CM | POA: Insufficient documentation

## 2014-07-29 DIAGNOSIS — M5116 Intervertebral disc disorders with radiculopathy, lumbar region: Secondary | ICD-10-CM | POA: Insufficient documentation

## 2014-07-29 HISTORY — PX: LUMBAR LAMINECTOMY/DECOMPRESSION MICRODISCECTOMY: SHX5026

## 2014-07-29 LAB — TYPE AND SCREEN
ABO/RH(D): O POS
ANTIBODY SCREEN: NEGATIVE

## 2014-07-29 LAB — ABO/RH: ABO/RH(D): O POS

## 2014-07-29 SURGERY — LUMBAR LAMINECTOMY/DECOMPRESSION MICRODISCECTOMY
Anesthesia: General | Site: Back

## 2014-07-29 MED ORDER — PROPOFOL 10 MG/ML IV BOLUS
INTRAVENOUS | Status: AC
  Filled 2014-07-29: qty 20

## 2014-07-29 MED ORDER — ONDANSETRON HCL 4 MG/2ML IJ SOLN
INTRAMUSCULAR | Status: DC | PRN
  Administered 2014-07-29: 4 mg via INTRAVENOUS

## 2014-07-29 MED ORDER — LIDOCAINE HCL (CARDIAC) 20 MG/ML IV SOLN
INTRAVENOUS | Status: AC
  Filled 2014-07-29: qty 5

## 2014-07-29 MED ORDER — HYDROMORPHONE HCL 1 MG/ML IJ SOLN
0.2500 mg | INTRAMUSCULAR | Status: DC | PRN
  Administered 2014-07-29 (×2): 0.5 mg via INTRAVENOUS

## 2014-07-29 MED ORDER — THROMBIN 20000 UNITS EX SOLR
CUTANEOUS | Status: DC | PRN
  Administered 2014-07-29: 20 mL via TOPICAL

## 2014-07-29 MED ORDER — HYDROMORPHONE HCL 1 MG/ML IJ SOLN
INTRAMUSCULAR | Status: AC
  Filled 2014-07-29: qty 1

## 2014-07-29 MED ORDER — SUCCINYLCHOLINE CHLORIDE 20 MG/ML IJ SOLN
INTRAMUSCULAR | Status: AC
  Filled 2014-07-29: qty 1

## 2014-07-29 MED ORDER — PROPOFOL 10 MG/ML IV BOLUS
INTRAVENOUS | Status: DC | PRN
  Administered 2014-07-29: 150 mg via INTRAVENOUS

## 2014-07-29 MED ORDER — METHYLENE BLUE 1 % INJ SOLN
INTRAMUSCULAR | Status: AC
  Filled 2014-07-29: qty 10

## 2014-07-29 MED ORDER — BUPIVACAINE-EPINEPHRINE 0.25% -1:200000 IJ SOLN
INTRAMUSCULAR | Status: DC | PRN
Start: 2014-07-29 — End: 2014-07-29
  Administered 2014-07-29: 18 mL

## 2014-07-29 MED ORDER — FENTANYL CITRATE 0.05 MG/ML IJ SOLN
INTRAMUSCULAR | Status: AC
  Filled 2014-07-29: qty 5

## 2014-07-29 MED ORDER — THROMBIN 20000 UNITS EX SOLR
CUTANEOUS | Status: AC
  Filled 2014-07-29: qty 20000

## 2014-07-29 MED ORDER — ROCURONIUM BROMIDE 50 MG/5ML IV SOLN
INTRAVENOUS | Status: AC
  Filled 2014-07-29: qty 1

## 2014-07-29 MED ORDER — LIDOCAINE HCL (CARDIAC) 20 MG/ML IV SOLN
INTRAVENOUS | Status: DC | PRN
  Administered 2014-07-29: 60 mg via INTRAVENOUS

## 2014-07-29 MED ORDER — FENTANYL CITRATE 0.05 MG/ML IJ SOLN
INTRAMUSCULAR | Status: DC | PRN
  Administered 2014-07-29: 100 ug via INTRAVENOUS
  Administered 2014-07-29 (×2): 25 ug via INTRAVENOUS
  Administered 2014-07-29: 50 ug via INTRAVENOUS

## 2014-07-29 MED ORDER — LACTATED RINGERS IV SOLN
INTRAVENOUS | Status: DC | PRN
  Administered 2014-07-29 (×3): via INTRAVENOUS

## 2014-07-29 MED ORDER — METHYLPREDNISOLONE ACETATE 40 MG/ML IJ SUSP
INTRAMUSCULAR | Status: DC | PRN
  Administered 2014-07-29: 1 mL

## 2014-07-29 MED ORDER — EPHEDRINE SULFATE 50 MG/ML IJ SOLN
INTRAMUSCULAR | Status: AC
  Filled 2014-07-29: qty 1

## 2014-07-29 MED ORDER — PROPOFOL INFUSION 10 MG/ML OPTIME
INTRAVENOUS | Status: DC | PRN
  Administered 2014-07-29: 50 ug/kg/min via INTRAVENOUS

## 2014-07-29 MED ORDER — BUPIVACAINE-EPINEPHRINE (PF) 0.25% -1:200000 IJ SOLN
INTRAMUSCULAR | Status: AC
  Filled 2014-07-29: qty 30

## 2014-07-29 MED ORDER — SUCCINYLCHOLINE CHLORIDE 20 MG/ML IJ SOLN
INTRAMUSCULAR | Status: DC | PRN
  Administered 2014-07-29: 100 mg via INTRAVENOUS

## 2014-07-29 MED ORDER — SODIUM CHLORIDE 0.9 % IJ SOLN
INTRAMUSCULAR | Status: AC
  Filled 2014-07-29: qty 10

## 2014-07-29 MED ORDER — ROCURONIUM BROMIDE 100 MG/10ML IV SOLN
INTRAVENOUS | Status: DC | PRN
  Administered 2014-07-29: 20 mg via INTRAVENOUS

## 2014-07-29 MED ORDER — PHENYLEPHRINE 40 MCG/ML (10ML) SYRINGE FOR IV PUSH (FOR BLOOD PRESSURE SUPPORT)
PREFILLED_SYRINGE | INTRAVENOUS | Status: AC
  Filled 2014-07-29: qty 10

## 2014-07-29 MED ORDER — ONDANSETRON HCL 4 MG/2ML IJ SOLN
INTRAMUSCULAR | Status: AC
  Filled 2014-07-29: qty 2

## 2014-07-29 MED ORDER — MIDAZOLAM HCL 2 MG/2ML IJ SOLN
INTRAMUSCULAR | Status: AC
  Filled 2014-07-29: qty 2

## 2014-07-29 MED ORDER — METHYLPREDNISOLONE ACETATE 40 MG/ML IJ SUSP
INTRAMUSCULAR | Status: AC
  Filled 2014-07-29: qty 1

## 2014-07-29 MED ORDER — 0.9 % SODIUM CHLORIDE (POUR BTL) OPTIME
TOPICAL | Status: DC | PRN
  Administered 2014-07-29: 1000 mL

## 2014-07-29 MED ORDER — METHYLENE BLUE 1 % INJ SOLN
INTRAMUSCULAR | Status: DC | PRN
  Administered 2014-07-29: 1 mL

## 2014-07-29 MED ORDER — MIDAZOLAM HCL 5 MG/5ML IJ SOLN
INTRAMUSCULAR | Status: DC | PRN
  Administered 2014-07-29: 1 mg via INTRAVENOUS

## 2014-07-29 MED ORDER — EPHEDRINE SULFATE 50 MG/ML IJ SOLN
INTRAMUSCULAR | Status: DC | PRN
  Administered 2014-07-29: 5 mg via INTRAVENOUS
  Administered 2014-07-29: 15 mg via INTRAVENOUS
  Administered 2014-07-29: 5 mg via INTRAVENOUS
  Administered 2014-07-29: 25 mg via INTRAVENOUS

## 2014-07-29 SURGICAL SUPPLY — 75 items
APL SKNCLS STERI-STRIP NONHPOA (GAUZE/BANDAGES/DRESSINGS) ×1
BENZOIN TINCTURE PRP APPL 2/3 (GAUZE/BANDAGES/DRESSINGS) ×3 IMPLANT
BUR ROUND PRECISION 4.0 (BURR) ×2 IMPLANT
BUR ROUND PRECISION 4.0MM (BURR) ×1
CANISTER SUCTION 2500CC (MISCELLANEOUS) ×3 IMPLANT
CARTRIDGE OIL MAESTRO DRILL (MISCELLANEOUS) ×1 IMPLANT
CLOSURE WOUND 1/2 X4 (GAUZE/BANDAGES/DRESSINGS) ×1
CORDS BIPOLAR (ELECTRODE) ×3 IMPLANT
COVER SURGICAL LIGHT HANDLE (MISCELLANEOUS) ×3 IMPLANT
DIFFUSER DRILL AIR PNEUMATIC (MISCELLANEOUS) ×3 IMPLANT
DRAIN CHANNEL 15F RND FF W/TCR (WOUND CARE) IMPLANT
DRAPE POUCH INSTRU U-SHP 10X18 (DRAPES) ×6 IMPLANT
DRAPE SURG 17X23 STRL (DRAPES) ×12 IMPLANT
DURAPREP 26ML APPLICATOR (WOUND CARE) ×3 IMPLANT
ELECT BLADE 4.0 EZ CLEAN MEGAD (MISCELLANEOUS)
ELECT CAUTERY BLADE 6.4 (BLADE) ×3 IMPLANT
ELECT REM PT RETURN 9FT ADLT (ELECTROSURGICAL) ×3
ELECTRODE BLDE 4.0 EZ CLN MEGD (MISCELLANEOUS) IMPLANT
ELECTRODE REM PT RTRN 9FT ADLT (ELECTROSURGICAL) ×1 IMPLANT
EVACUATOR SILICONE 100CC (DRAIN) IMPLANT
FILTER STRAW FLUID ASPIR (MISCELLANEOUS) ×3 IMPLANT
GAUZE SPONGE 4X4 12PLY STRL (GAUZE/BANDAGES/DRESSINGS) ×3 IMPLANT
GAUZE SPONGE 4X4 16PLY XRAY LF (GAUZE/BANDAGES/DRESSINGS) ×3 IMPLANT
GLOVE BIO SURGEON STRL SZ7 (GLOVE) IMPLANT
GLOVE BIO SURGEON STRL SZ8 (GLOVE) ×3 IMPLANT
GLOVE BIOGEL PI IND STRL 6.5 (GLOVE) ×1 IMPLANT
GLOVE BIOGEL PI IND STRL 7.0 (GLOVE) IMPLANT
GLOVE BIOGEL PI IND STRL 8 (GLOVE) ×1 IMPLANT
GLOVE BIOGEL PI INDICATOR 6.5 (GLOVE) ×2
GLOVE BIOGEL PI INDICATOR 7.0 (GLOVE)
GLOVE BIOGEL PI INDICATOR 8 (GLOVE) ×2
GLOVE ECLIPSE 6.5 STRL STRAW (GLOVE) ×3 IMPLANT
GOWN STRL REUS W/ TWL LRG LVL3 (GOWN DISPOSABLE) ×2 IMPLANT
GOWN STRL REUS W/ TWL XL LVL3 (GOWN DISPOSABLE) ×2 IMPLANT
GOWN STRL REUS W/TWL LRG LVL3 (GOWN DISPOSABLE) ×6
GOWN STRL REUS W/TWL XL LVL3 (GOWN DISPOSABLE) ×6
IV CATH 14GX2 1/4 (CATHETERS) ×3 IMPLANT
KIT BASIN OR (CUSTOM PROCEDURE TRAY) ×3 IMPLANT
KIT POSITION SURG JACKSON T1 (MISCELLANEOUS) ×3 IMPLANT
KIT ROOM TURNOVER OR (KITS) ×3 IMPLANT
NEEDLE 18GX1X1/2 (RX/OR ONLY) (NEEDLE) ×3 IMPLANT
NEEDLE 22X1 1/2 (OR ONLY) (NEEDLE) ×3 IMPLANT
NEEDLE HYPO 25GX1X1/2 BEV (NEEDLE) ×3 IMPLANT
NEEDLE SPNL 18GX3.5 QUINCKE PK (NEEDLE) ×9 IMPLANT
NEURO MONITORING STIM (LABOR (TRAVEL & OVERTIME)) ×3 IMPLANT
NS IRRIG 1000ML POUR BTL (IV SOLUTION) ×3 IMPLANT
OIL CARTRIDGE MAESTRO DRILL (MISCELLANEOUS) ×3
PACK LAMINECTOMY ORTHO (CUSTOM PROCEDURE TRAY) ×3 IMPLANT
PACK UNIVERSAL I (CUSTOM PROCEDURE TRAY) ×3 IMPLANT
PAD ARMBOARD 7.5X6 YLW CONV (MISCELLANEOUS) ×6 IMPLANT
PATTIES SURGICAL .5 X.5 (GAUZE/BANDAGES/DRESSINGS) IMPLANT
PATTIES SURGICAL .5 X1 (DISPOSABLE) ×3 IMPLANT
SPONGE INTESTINAL PEANUT (DISPOSABLE) ×3 IMPLANT
SPONGE SURGIFOAM ABS GEL 100 (HEMOSTASIS) ×3 IMPLANT
SPONGE SURGIFOAM ABS GEL SZ50 (HEMOSTASIS) ×3 IMPLANT
STRIP CLOSURE SKIN 1/2X4 (GAUZE/BANDAGES/DRESSINGS) ×2 IMPLANT
SURGIFLO TRUKIT (HEMOSTASIS) IMPLANT
SUT MNCRL AB 4-0 PS2 18 (SUTURE) ×3 IMPLANT
SUT VIC AB 0 CT1 18XCR BRD 8 (SUTURE) IMPLANT
SUT VIC AB 0 CT1 27 (SUTURE)
SUT VIC AB 0 CT1 27XBRD ANBCTR (SUTURE) IMPLANT
SUT VIC AB 0 CT1 8-18 (SUTURE)
SUT VIC AB 1 CT1 18XCR BRD 8 (SUTURE) ×2 IMPLANT
SUT VIC AB 1 CT1 8-18 (SUTURE) ×6
SUT VIC AB 2-0 CT2 18 VCP726D (SUTURE) ×3 IMPLANT
SYR 20CC LL (SYRINGE) IMPLANT
SYR BULB IRRIGATION 50ML (SYRINGE) ×3 IMPLANT
SYR CONTROL 10ML LL (SYRINGE) ×6 IMPLANT
SYR TB 1ML 26GX3/8 SAFETY (SYRINGE) ×6 IMPLANT
SYR TB 1ML LUER SLIP (SYRINGE) ×3 IMPLANT
TOWEL OR 17X24 6PK STRL BLUE (TOWEL DISPOSABLE) ×3 IMPLANT
TOWEL OR 17X26 10 PK STRL BLUE (TOWEL DISPOSABLE) ×3 IMPLANT
TRAY FOLEY CATH 16FRSI W/METER (SET/KITS/TRAYS/PACK) ×3 IMPLANT
WATER STERILE IRR 1000ML POUR (IV SOLUTION) IMPLANT
YANKAUER SUCT BULB TIP NO VENT (SUCTIONS) ×3 IMPLANT

## 2014-07-29 NOTE — Op Note (Signed)
NAME:  Pamela [name], Pamela [name]         ACCOUNT NO.:  [id number]  MEDICAL RECORD NO.:  [id number]  LOCATION:  [facility]                         FACILITY:  [facility]  PHYSICIAN:  Estill BambergMark Aadvik Roker, MD      DATE OF BIRTH:  14-Dec-1960  DATE OF PROCEDURE:  07/29/2014                              OPERATIVE REPORT   PREOPERATIVE DIAGNOSES: 1. 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 2. Right L4 radiculopathy.  POSTOPERATIVE DIAGNOSES: 1. 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 2. Right L4 radiculopathy.  PROCEDURE:  Transpedicular right-sided L4-5 decompression using a Wiltse approach with removal of a large extruded L4-5 disk fragment.  SURGEON:  Estill BambergMark Jshaun Abernathy, MD  ASSISTANT:  Jason CoopKayla McKenzie, PA-C  ANESTHESIA:  General endotracheal anesthesia.  COMPLICATIONS:  None.  DISPOSITION:  Stable.  ESTIMATED BLOOD LOSS:  Minimal.  INDICATIONS FOR SURGERY:  Briefly, [name] is a pleasant 53 year old female who did present to me with a 1 year history of ongoing pain in her right leg.  An MRI did clearly reveal a large foraminal/extra foraminal right-sided L4-5 disk herniation, clearly causing compression of the exiting L4 nerve on the right.  The patient did have multiple forms of conservative care, including epidural injections, but she did continue to have ongoing pain.  We therefore did discuss proceeding with the procedure noted above.  The patient did understand that given the duration of her symptoms and the location of her disk herniation, that she may have residual symptoms after surgery.  OPERATIVE DETAILS:  On July 29, 2014, the patient was brought to surgery and general endotracheal anesthesia was administered.  The patient was placed prone on a well-padded flat Jackson bed with spinal frame.  Antibiotics were given and a time-out procedure was performed. A right-sided paramedian incision was made, 1 cm lateral to the midline on the  right side.  The fascia was incised.  Using a Wiltse approach, I was able to identify the posterolateral gutter on the right, as well as the lateral aspect of the pars interarticularis of L4.  A self-retaining retractor was placed, and a lateral intraoperative radiograph did confirm the appropriate operative level.  I then placed a self-retaining McCulloch retractor.  I then removed the intertransverse membrane in the lateral aspect of the L4-5 facet joint.  I was able to identify the exiting L3 nerve, which was clearly noted to be under substantial tension and pressure.  There were substantial adhesions between the exiting L3 nerve and the intervertebral disk fragment that was compressing it.  I was however able to release the adhesions, and I was able to superiorly and laterally retract nerves.  A very large disk fragment was readily identified and removed in multiple fragments.  In doing so, I was able to adequately decompress the exiting L3 nerve.  I was very pleased with the decompression that I was able to accomplish. The wound was copiously irrigated.  The epidural bleeding was controlled using bipolar electrocautery and in addition a Gelfoam.  A 40 mg of Depo- Medrol was introduced about the foraminal/extraforaminal region and the region of the L3 nerve on the right.  Of note, neurologic monitoring was used throughout the surgery, and there was no abnormal  EMG activity encountered throughout the entire surgery.  The fascia was then closed using #1 Vicryl.  The skin was then closed in layers using 0 Vicryl, followed by 2-0 Vicryl, followed by 3-0 Monocryl.  Benzoin and Steri- Strips were applied, followed by a sterile dressing.  All instrument counts were correct at the termination of the procedure.  Of note, Jason CoopKayla McKenzie was my assistant throughout the surgery, and did aid in retraction, suctioning, and closure.     Estill BambergMark Saatvik Thielman, MD     MD/MEDQ  D:  07/29/2014  T:   07/29/2014  Job:  [id number]

## 2014-07-29 NOTE — Discharge Instructions (Signed)

## 2014-07-29 NOTE — Anesthesia Preprocedure Evaluation (Addendum)
Anesthesia Evaluation  Patient identified by MRN, date of birth, ID band Patient awake    Reviewed: Allergy & Precautions, H&P , NPO status , Patient's Chart, lab work & pertinent test results  Airway Mallampati: II  TM Distance: >3 FB Neck ROM: Full    Dental  (+) Teeth Intact, Dental Advisory Given, Partial Upper, Partial Lower   Pulmonary shortness of breath and with exertion, pneumonia -, former smoker,  breath sounds clear to auscultation        Cardiovascular Rhythm:Regular Rate:Normal  History of heat murmur w/u neg as per patient. CE   Neuro/Psych Seizures -, Well Controlled,  Anxiety Depression    GI/Hepatic Neg liver ROS, hiatal hernia, GERD-  Medicated and Controlled,  Endo/Other  negative endocrine ROS  Renal/GU negative Renal ROS     Musculoskeletal   Abdominal   Peds  Hematology   Anesthesia Other Findings   Reproductive/Obstetrics                           Anesthesia Physical Anesthesia Plan  ASA: III  Anesthesia Plan: General   Post-op Pain Management:    Induction: Intravenous  Airway Management Planned: Oral ETT  Additional Equipment:   Intra-op Plan:   Post-operative Plan: Extubation in OR  Informed Consent: I have reviewed the patients History and Physical, chart, labs and discussed the procedure including the risks, benefits and alternatives for the proposed anesthesia with the patient or authorized representative who has indicated his/her understanding and acceptance.   Dental advisory given  Plan Discussed with: CRNA and Anesthesiologist  Anesthesia Plan Comments:        Anesthesia Quick Evaluation

## 2014-07-29 NOTE — Transfer of Care (Signed)
Immediate Anesthesia Transfer of Care Note  Patient: Pamela [name]  Procedure(s) Performed: Procedure(s) with comments: LUMBAR LAMINECTOMY/DECOMPRESSION MICRODISCECTOMY (N/A) - Lumbar 4-5 decompression  Patient Location: PACU  Anesthesia Type:General  Level of Consciousness: awake, alert , oriented and patient cooperative  Airway & Oxygen Therapy: Patient Spontanous Breathing and Patient connected to nasal cannula oxygen  Post-op Assessment: Report given to PACU RN, Post -op Vital signs reviewed and stable and Patient moving all extremities X 4  Post vital signs: Reviewed and stable  Complications: No apparent anesthesia complications

## 2014-07-29 NOTE — Anesthesia Postprocedure Evaluation (Signed)
  Anesthesia Post-op Note  Patient: Pamela [name]  Procedure(s) Performed: Procedure(s) with comments: LUMBAR LAMINECTOMY/DECOMPRESSION MICRODISCECTOMY (N/A) - Lumbar 4-5 decompression  Patient Location: PACU  Anesthesia Type:General  Level of Consciousness: awake  Airway and Oxygen Therapy: Patient Spontanous Breathing  Post-op Pain: mild  Post-op Assessment: Post-op Vital signs reviewed  Post-op Vital Signs: Reviewed  Last Vitals:  Filed Vitals:   07/29/14 1341  BP: 109/58  Pulse: 68  Temp:   Resp: 16    Complications: No apparent anesthesia complications

## 2014-07-29 NOTE — Anesthesia Procedure Notes (Signed)
Procedure Name: Intubation Date/Time: 07/29/2014 8:38 AM Performed by: Rogelia BogaMUELLER, Cougar Imel P Pre-anesthesia Checklist: Patient identified, Emergency Drugs available, Suction available, Patient being monitored and Timeout performed Patient Re-evaluated:Patient Re-evaluated prior to inductionOxygen Delivery Method: Circle system utilized Preoxygenation: Pre-oxygenation with 100% oxygen Intubation Type: IV induction Ventilation: Mask ventilation without difficulty Laryngoscope Size: Mac and 4 Grade View: Grade I Tube type: Oral Tube size: 7.5 mm Number of attempts: 1 Airway Equipment and Method: Stylet Placement Confirmation: ETT inserted through vocal cords under direct vision,  positive ETCO2 and breath sounds checked- equal and bilateral Secured at: 22 cm Tube secured with: Tape Dental Injury: Teeth and Oropharynx as per pre-operative assessment

## 2014-07-29 NOTE — H&P (Signed)
PREOPERATIVE H&P  Chief Complaint: right leg pain  HPI: Pamela [name] is a 53 y.o. female who presents with ongoing pain in the right leg x 1 year  MRI reveals a foraminal HNP on the right at L4/5  Patient has failed multiple forms of conservative care and continues to have pain (see office notes for additional details regarding the patient's full course of treatment)  Past Medical History  Diagnosis Date  . 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  . Seizures     epilepsy, started at age 53  . Shortness of breath dyspnea   . Bronchitis   . Pneumonia   . Depression   . Anxiety     takes Effexor  . Family history of cancer     Pt reports family hx of breast, colon, throat and prostate, is watched closely but no diagnosis  . Heart murmur     Pt denies seeing a cardiologist on a regular basis, heart studies done over 20 years ago.  Marland Kitchen. GERD (gastroesophageal reflux disease)     Pt reports chest pain about 5 years ago but GERD was contributing factor.  Marland Kitchen. History of hiatal hernia    Past Surgical History  Procedure Laterality Date  . Abdominal hysterectomy    . Removed cysts from bilateral ovaries Bilateral 2010  . Back surgery      scoliosis  . Shoulder surgery Right     Removed bone spurs  . Eye surgery      tear duct surgery   History   Social History  . Marital Status: Single    Spouse Name: N/A    Number of Children: N/A  . Years of Education: N/A   Social History Main Topics  . Smoking status: Former Smoker    Types: Cigarettes    Quit date: 03/07/2009  . Smokeless tobacco: Never Used  . Alcohol Use: Yes     Comment: history of abuse, sober for 21 years  . 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 Sexual Activity: None   Other Topics Concern  . None   Social History Narrative   History reviewed. No  pertinent family history. Allergies  Allergen Reactions  . Zoloft [Sertraline Hcl]     lethargic   Prior to Admission medications   Medication Sig Start Date End Date Taking? Authorizing Provider  acetaminophen (TYLENOL) 325 MG tablet Take 325-650 mg by mouth every 6 (six) hours as needed for mild pain, fever or headache.   Yes Historical Provider, MD  amitriptyline (ELAVIL) 25 MG tablet Take 25 mg by mouth at bedtime.   Yes Historical Provider, MD  Calcium Carbonate (CALTRATE 600 PO) Take 1 tablet by mouth daily.   Yes Historical Provider, MD  carbamazepine (TEGRETOL) 200 MG tablet Take 600 mg by mouth 2 (two) times daily. 06/24/14  Yes Historical Provider, MD  Multiple Vitamin (MULTIVITAMIN WITH MINERALS) TABS Take 1 tablet by mouth daily.   Yes Historical Provider, MD  Probiotic Product (PROBIOTIC DAILY PO) Take 1 capsule by mouth daily.   Yes Historical Provider, MD  traMADol (ULTRAM) 50 MG tablet Take 50-100 mg by mouth 2 (two) times daily as needed for moderate pain.   Yes Historical Provider, MD  traZODone (DESYREL) 150 MG tablet Take 75 mg by mouth  2 (two) times daily.   Yes Historical Provider, MD  venlafaxine XR (EFFEXOR-XR) 75 MG 24 hr capsule Take 75 mg by mouth every morning.   Yes Historical Provider, MD     All other systems have been reviewed and were otherwise negative with the exception of those mentioned in the HPI and as above.  Physical Exam: Filed Vitals:   07/29/14 0659  BP: 95/58  Pulse: 57  Temp: 97 F (36.1 C)  Resp: 18    General: Alert, no acute distress Cardiovascular: No pedal edema Respiratory: No cyanosis, no use of accessory musculature Skin: No lesions in the area of chief complaint Neurologic: Sensation intact distally Psychiatric: Patient is competent for consent with normal mood and affect Lymphatic: No axillary or cervical lymphadenopathy  MUSCULOSKELETAL: + SLR on right  Assessment/Plan: Low back pain with right leg pain Plan for  Procedure(s): LUMBAR LAMINECTOMY L4/5 on right   Emilee HeroUMONSKI,Jaelani Posa LEONARD, MD 07/29/2014 8:06 AM

## 2014-08-03 ENCOUNTER — Encounter (HOSPITAL_COMMUNITY): Payer: Self-pay | Admitting: Orthopedic Surgery

## 2015-03-29 ENCOUNTER — Emergency Department (HOSPITAL_COMMUNITY)
Admission: EM | Admit: 2015-03-29 | Discharge: 2015-03-29 | Discharge status: Against Medical Advice | Payer: BLUE CROSS/BLUE SHIELD | Attending: Emergency Medicine | Admitting: Emergency Medicine

## 2015-03-29 ENCOUNTER — Encounter (HOSPITAL_COMMUNITY): Payer: Self-pay | Admitting: *Deleted

## 2015-03-29 DIAGNOSIS — R011 Cardiac murmur, unspecified: Secondary | ICD-10-CM | POA: Insufficient documentation

## 2015-03-29 DIAGNOSIS — R1110 Vomiting, unspecified: Secondary | ICD-10-CM | POA: Diagnosis not present

## 2015-03-29 NOTE — ED Notes (Signed)
Pt and family member requesting to leave, states now that the pt vomited she feels much better

## 2015-03-29 NOTE — ED Notes (Signed)
Pt reports she feels much better after vomiting.  Person with pt states that pt wants to leave and is also concerned re financial responsibilities after visit to the ED.  Pt made aware that she can come back to the ED if she does not feel any better.

## 2015-03-29 NOTE — ED Notes (Addendum)
Pt reports n/v since 1700 today after eating cereal and taking her meds.  Pt reports she also felt nervous and felt like she was getting ready to have an epileptic episode.  Pt denies abd pain or diarrhea at this time.  Pt states that she thought it was from feeling really hot while at work.  Pt also reports that she was already feeling bad prior to first emesis.

## 2015-11-29 ENCOUNTER — Encounter: Payer: Self-pay | Admitting: *Deleted

## 2015-11-30 ENCOUNTER — Ambulatory Visit (INDEPENDENT_AMBULATORY_CARE_PROVIDER_SITE_OTHER): Payer: BLUE CROSS/BLUE SHIELD | Admitting: Neurology

## 2015-11-30 ENCOUNTER — Encounter: Payer: Self-pay | Admitting: Neurology

## 2015-11-30 VITALS — BP 97/63 | HR 57 | Ht 68.0 in | Wt 163.5 lb

## 2015-11-30 DIAGNOSIS — G40409 Other generalized epilepsy and epileptic syndromes, not intractable, without status epilepticus: Secondary | ICD-10-CM | POA: Diagnosis not present

## 2015-11-30 DIAGNOSIS — R202 Paresthesia of skin: Secondary | ICD-10-CM

## 2015-11-30 DIAGNOSIS — G40909 Epilepsy, unspecified, not intractable, without status epilepticus: Secondary | ICD-10-CM

## 2015-11-30 HISTORY — DX: Epilepsy, unspecified, not intractable, without status epilepticus: G40.909

## 2015-11-30 MED ORDER — LEVETIRACETAM 500 MG PO TABS: ORAL_TABLET | ORAL | Status: DC

## 2015-11-30 NOTE — Patient Instructions (Addendum)
We will start Keppra for the seizures, begin a taper off of the carbamazepine, going down by one tablet every 3 weeks until off of the medication.  Epilepsy Epilepsy is a disorder in which a person has repeated seizures over time. A seizure is a release of abnormal electrical activity in the brain. Seizures can cause a change in attention, behavior, or the ability to remain awake and alert (altered mental status). Seizures often involve uncontrollable shaking (convulsions).  Most people with epilepsy lead normal lives. However, people with epilepsy are at an increased risk of falls, accidents, and injuries. Therefore, it is important to begin treatment right away. CAUSES  Epilepsy has many possible causes. Anything that disturbs the normal pattern of brain cell activity can lead to seizures. This may include:   Head injury.  Birth trauma.  High fever as a child.  Stroke.  Bleeding into or around the brain.  Certain drugs.  Prolonged low oxygen, such as what occurs after CPR efforts.  Abnormal brain development.  Certain illnesses, such as meningitis, encephalitis (brain infection), malaria, and other infections.  An imbalance of nerve signaling chemicals (neurotransmitters).  SIGNS AND SYMPTOMS  The symptoms of a seizure can vary greatly from one person to another. Right before a seizure, you may have a warning (aura) that a seizure is about to occur. An aura may include the following symptoms:  Fear or anxiety.  Nausea.  Feeling like the room is spinning (vertigo).  Vision changes, such as seeing flashing lights or spots. Common symptoms during a seizure include:  Abnormal sensations, such as an abnormal smell or a bitter taste in the mouth.   Sudden, general body stiffness.   Convulsions that involve rhythmic jerking of the face, arm, or leg on one or both sides.   Sudden change in consciousness.   Appearing to be awake but not responding.   Appearing to  be asleep but cannot be awakened.   Grimacing, chewing, lip smacking, drooling, tongue biting, or loss of bowel or bladder control. After a seizure, you may feel sleepy for a while. DIAGNOSIS  Your health care provider will ask about your symptoms and take a medical history. Descriptions from any witnesses to your seizures will be very helpful in the diagnosis. A physical exam, including a detailed neurological exam, is necessary. Various tests may be done, such as:   An electroencephalogram (EEG). This is a painless test of your brain waves. In this test, a diagram is created of your brain waves. These diagrams can be interpreted by a specialist.  An MRI of the brain.   A CT scan of the brain.   A spinal tap (lumbar puncture, LP).  Blood tests to check for signs of infection or abnormal blood chemistry. TREATMENT  There is no cure for epilepsy, but it is generally treatable. Once epilepsy is diagnosed, it is important to begin treatment as soon as possible. For most people with epilepsy, seizures can be controlled with medicines. The following may also be used:  A pacemaker for the brain (vagus nerve stimulator) can be used for people with seizures that are not well controlled by medicine.  Surgery on the brain. For some people, epilepsy eventually goes away. HOME CARE INSTRUCTIONS   Follow your health care provider's recommendations on driving and safety in normal activities.  Get enough rest. Lack of sleep can cause seizures.  Only take over-the-counter or prescription medicines as directed by your health care provider. Take any prescribed medicine exactly  as directed.  Avoid any known triggers of your seizures.  Keep a seizure diary. Record what you recall about any seizure, especially any possible trigger.   Make sure the people you live and work with know that you are prone to seizures. They should receive instructions on how to help you. In general, a witness to a  seizure should:   Cushion your head and body.   Turn you on your side.   Avoid unnecessarily restraining you.   Not place anything inside your mouth.   Call for emergency medical help if there is any question about what has occurred.   Follow up with your health care provider as directed. You may need regular blood tests to monitor the levels of your medicine.  SEEK MEDICAL CARE IF:   You develop signs of infection or other illness. This might increase the risk of a seizure.   You seem to be having more frequent seizures.   Your seizure pattern is changing.  SEEK IMMEDIATE MEDICAL CARE IF:   You have a seizure that does not stop after a few moments.   You have a seizure that causes any difficulty in breathing.   You have a seizure that results in a very severe headache.   You have a seizure that leaves you with the inability to speak or use a part of your body.    This information is not intended to replace advice given to you by your health care provider. Make sure you discuss any questions you have with your health care provider.   Document Released: 08/21/2005 Document Revised: 06/11/2013 Document Reviewed: 04/02/2013 [employer] Interactive Patient Education [employer]

## 2015-11-30 NOTE — Progress Notes (Signed)
Reason for visit: Seizures  Referring physician: Dr. Edyth Gunnels is a 55 y.o. female  History of present illness:  [name] is a 55 year old right-handed black female with a history of substance abuse, and a history of seizures. The patient indicates that she began having seizures around age 61 or 24, and she has been on carbamazepine for quite a number of years taking 600 mg twice daily. The patient is tolerating the medication well, she has not had a seizure in around 20 years. The patient indicates that she was abusing cocaine around the time of her last seizure. She will have an event of dj vu and associated headache, with eventual generalized tonic clonic seizure. The patient currently is operating a motor vehicle, she is working. The patient does report some mild gait instability. She does have some discomfort in the low back, with a history of prior lumbosacral spine surgery. The patient has some numbness in the hands that comes and goes. She has been told she may have fibromyalgia. In the past, Lyrica has resulted in significant constipation issues. The patient goes on to say that she takes Ultram if needed for pain. The patient denies any issues controlling the bowels or the bladder. She is sent to this office for evaluation of management of her seizures.  Past Medical History  Diagnosis Date  . 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  . Seizures (HCC)     epilepsy, started at age 39  . Shortness of breath dyspnea   . Bronchitis   . Pneumonia   . Depression   . Anxiety     takes Effexor  . Family history of cancer     Pt reports family hx of breast, colon, throat and prostate, is watched closely but no diagnosis  . Heart murmur     Pt denies seeing a cardiologist on a regular basis, heart studies done over 20 years ago.  Marland Kitchen GERD (gastroesophageal reflux disease)     Pt reports chest pain about 5 years ago but GERD  was contributing factor.  Marland Kitchen History of hiatal hernia   . 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 Abdominal hysterectomy    . Removed cysts from bilateral ovaries Bilateral 2010  . Back surgery      scoliosis  . Shoulder surgery Right     Removed bone spurs  . Eye surgery      tear duct surgery  . Lumbar laminectomy/decompression microdiscectomy N/A 07/29/2014    Procedure: LUMBAR LAMINECTOMY/DECOMPRESSION MICRODISCECTOMY;  Surgeon: Emilee Hero, MD;  Location: [facility] OR;  Service: Orthopedics;  Laterality: N/A;  Lumbar 4-5 decompression    Family History  Problem Relation Age of Onset  . Prostate cancer Father   . Diabetes Father   . Breast cancer Mother   . Prostate cancer Brother   . Hepatitis C Brother   . Seizures Neg Hx     Social history:  reports that she quit smoking about 6 years ago. Her smoking use included Cigarettes. She has never used smokeless tobacco. She reports that she does not drink alcohol or use illicit drugs.  Medications:  Prior to Admission medications   Medication Sig Start Date End Date Taking? Authorizing Provider  acetaminophen (TYLENOL) 325 MG tablet Take 325-650 mg by mouth every 6 (six) hours as needed for mild pain, fever or headache.  Yes Historical Provider, MD  amitriptyline (ELAVIL) 25 MG tablet Take 25 mg by mouth at bedtime.   Yes Historical Provider, MD  Calcium Carbonate (CALTRATE 600 PO) Take 1 tablet by mouth daily.   Yes Historical Provider, MD  carbamazepine (TEGRETOL) 200 MG tablet Take 600 mg by mouth 2 (two) times daily. 06/24/14  Yes Historical Provider, MD  meloxicam (MOBIC) 15 MG tablet TAKE 1 TABLET BY MOUTH EVERY DAY AS NEEDED WITH FOOD 11/11/15  Yes Historical Provider, MD  Multiple Vitamin (MULTIVITAMIN WITH MINERALS) TABS Take 1 tablet by mouth daily.   Yes Historical Provider, MD  Probiotic Product (PROBIOTIC DAILY PO) Take 1 capsule by mouth daily.   Yes Historical Provider,  MD  traZODone (DESYREL) 150 MG tablet Take 75 mg by mouth 2 (two) times daily.   Yes Historical Provider, MD  venlafaxine XR (EFFEXOR-XR) 75 MG 24 hr capsule Take 75 mg by mouth every morning.   Yes Historical Provider, MD      Allergies  Allergen Reactions  . Zoloft [Sertraline Hcl]     lethargic    ROS:  Out of a complete 14 system review of symptoms, the patient complains only of the following symptoms, and all other reviewed systems are negative.  Fatigue Swelling in the legs Ringing in the ears Eye pain Shortness of breath, cough Constipation Feeling hot, cold Joint pain, achy muscles Allergies, runny nose Headache, numbness, weakness, seizures Anxiety, not enough sleep, decreased energy Insomnia  Blood pressure 97/63, pulse 57, height 5\' 8"  (1.727 m), weight 163 lb 8 oz (74.163 kg).  Physical Exam  General: The patient is alert and cooperative at the time of the examination.  Eyes: Pupils are equal, round, and reactive to light. Discs are flat bilaterally.  Neck: The neck is supple, no carotid bruits are noted.  Respiratory: The respiratory examination is clear.  Cardiovascular: The cardiovascular examination reveals a regular rate and rhythm, no obvious murmurs or rubs are noted.  Skin: Extremities are without significant edema.  Neurologic Exam  Mental status: The patient is alert and oriented x 3 at the time of the examination. The patient has apparent normal recent and remote memory, with an apparently normal attention span and concentration ability.  Cranial nerves: Facial symmetry is present. There is good sensation of the face to pinprick and soft touch bilaterally. The strength of the facial muscles and the muscles to head turning and shoulder shrug are normal bilaterally. Speech is well enunciated, no aphasia or dysarthria is noted. Extraocular movements are full. Visual fields are full. The tongue is midline, and the patient has symmetric elevation of  the soft palate. No obvious hearing deficits are noted.  Motor: The motor testing reveals 5 over 5 strength of all 4 extremities. Good symmetric motor tone is noted throughout.  Sensory: Sensory testing is intact to pinprick, soft touch, vibration sensation, and position sense on all 4 extremities. No evidence of extinction is noted.  Coordination: Cerebellar testing reveals good finger-nose-finger and heel-to-shin bilaterally. Tinel's sign is negative at the wrists bilaterally.  Gait and station: Gait is normal. Tandem gait is normal. Romberg is negative. No drift is seen.  Reflexes: Deep tendon reflexes are symmetric and normal bilaterally. Toes are downgoing bilaterally.   Assessment/Plan:  1. History of seizures, well controlled  2. Past history of substance abuse  3. Chronic low back pain, right leg pain  4. Bilateral hand numbness  The patient is doing well with her seizures. She is amenable to potentially  switching off of carbamazepine. The patient is taking 6 tablets a day of this. This has long-term effects with bone health as well. The patient will be tapered off of the carbamazepine by 1 tablet every 3 weeks until off the drug. She will start Keppra taking 500 mg twice daily. I have instructed her not to take any Ultram as this may lower the seizure threshold. The patient will follow-up through this office in 4 months, but she will return for EMG and nerve conduction studies to evaluate her hand numbness and leg pain. She will have nerve conduction studies on both arms and the right leg, EMG on the right leg.  Marlan Palau MD 11/30/2015 9:59 AM  [facility] [address] [place], [place] [address]  Phone [phone] Fax [phone]

## 2015-12-22 ENCOUNTER — Ambulatory Visit (INDEPENDENT_AMBULATORY_CARE_PROVIDER_SITE_OTHER): Payer: BLUE CROSS/BLUE SHIELD | Admitting: Neurology

## 2015-12-22 ENCOUNTER — Encounter: Payer: Self-pay | Admitting: Neurology

## 2015-12-22 ENCOUNTER — Ambulatory Visit (INDEPENDENT_AMBULATORY_CARE_PROVIDER_SITE_OTHER): Payer: Self-pay | Admitting: Neurology

## 2015-12-22 DIAGNOSIS — G5602 Carpal tunnel syndrome, left upper limb: Secondary | ICD-10-CM

## 2015-12-22 DIAGNOSIS — G5601 Carpal tunnel syndrome, right upper limb: Secondary | ICD-10-CM | POA: Diagnosis not present

## 2015-12-22 DIAGNOSIS — G5603 Carpal tunnel syndrome, bilateral upper limbs: Secondary | ICD-10-CM

## 2015-12-22 DIAGNOSIS — M79604 Pain in right leg: Secondary | ICD-10-CM | POA: Insufficient documentation

## 2015-12-22 DIAGNOSIS — M545 Low back pain, unspecified: Secondary | ICD-10-CM

## 2015-12-22 DIAGNOSIS — R202 Paresthesia of skin: Secondary | ICD-10-CM

## 2015-12-22 NOTE — Progress Notes (Signed)
The patient comes in today for EMG and nerve conduction study. Nerve conduction studies show evidence of bilateral mild carpal tunnel syndrome. EMG on the right leg was unremarkable, no evidence of a lumbosacral radiculopathy. The patient reports low back pain and some pain going down the right leg at times. She has had scoliosis surgery with Harrington rods placed in the past. The patient will enter into exercise with water aerobics, if the back pain does not improve we may consider MRI evaluation the low back or a myelogram. This is unremarkable, right SI joint injection may be done. A prescription was given for bilateral wrist splints. She will wear this for at least 8 weeks, this is not effective, she will call our office and we will refer her to a hand surgeon. The patient also reports a trigger finger with the right middle finger.

## 2015-12-22 NOTE — Procedures (Signed)
     HISTORY:  Pamela BolderJacqueline [name] is a 55 year old patient with a history of low back pain, some right leg discomfort particularly with standing. She also reports some numbness in the hands. She comes in for an evaluation of a neuropathy or a lumbosacral radiculopathy.  NERVE CONDUCTION STUDIES:  Nerve conduction studies were performed on the upper extremities bilaterally. The distal motor latencies for the median nerves were prolonged bilaterally with normal motor amplitudes for these nerves. The distal motor latencies and motor amplitudes for the ulnar nerves were normal bilaterally. The F wave latencies and nerve conduction velocities for the median and ulnar nerves were normal bilaterally. The sensory latencies for the median nerves were prolonged bilaterally, normal for the ulnar nerves bilaterally.   Nerve conduction studies were performed on the right lower extremity. The distal motor latencies and motor amplitudes for the peroneal and posterior tibial nerves were within normal limits. The nerve conduction velocities for these nerves were also normal. The H reflex latency was normal. The sensory latency for the peroneal nerve was within normal limits.   EMG STUDIES:  EMG study was performed on the right lower extremity:  The tibialis anterior muscle reveals 2 to 4K motor units with full recruitment. No fibrillations or positive waves were seen. The peroneus tertius muscle reveals 2 to 4K motor units with full recruitment. No fibrillations or positive waves were seen. The medial gastrocnemius muscle reveals 1 to 3K motor units with full recruitment. No fibrillations or positive waves were seen. The vastus lateralis muscle reveals 2 to 4K motor units with full recruitment. No fibrillations or positive waves were seen. The iliopsoas muscle reveals 2 to 4K motor units with full recruitment. No fibrillations or positive waves were seen. The biceps femoris muscle (long head) reveals 2 to 4K  motor units with full recruitment. No fibrillations or positive waves were seen. The lumbosacral paraspinal muscles were tested at 3 levels, and revealed no abnormalities of insertional activity at all 3 levels tested. There was good relaxation.   IMPRESSION:  Nerve conduction studies done on both upper extremities and on the right lower extremity shows no evidence of a peripheral neuropathy, but there does appear to be evidence of bilateral mild carpal tunnel syndrome. EMG evaluation of the right lower extremity was unremarkable, without evidence of an overlying lumbosacral radiculopathy.  Marlan Palau. Keith Willis MD 12/22/2015 11:16 AM  [facility] [address] [place], [place] [address]  Phone [phone] Fax [phone]

## 2015-12-22 NOTE — Progress Notes (Signed)
Please refer to EMG and nerve conduction study procedure note. 

## 2016-02-22 ENCOUNTER — Telehealth: Payer: Self-pay | Admitting: Neurology

## 2016-02-22 NOTE — Telephone Encounter (Signed)
Pt called said she has DMV forms to be filled out verifying she is able to drive. Pt is aware there is a $50 fee. She will drop the forms off at the clinic

## 2016-03-01 ENCOUNTER — Telehealth: Payer: Self-pay | Admitting: *Deleted

## 2016-03-01 NOTE — Telephone Encounter (Signed)
Pt DMV form ready for pick up. Pt need to pay the fee for the form.

## 2016-03-02 NOTE — Telephone Encounter (Signed)
Pt called said her mother will pay for the fee tomorrow but she is requesting the [facility] [place]

## 2016-03-08 DIAGNOSIS — Z0289 Encounter for other administrative examinations: Secondary | ICD-10-CM

## 2016-03-18 ENCOUNTER — Emergency Department (HOSPITAL_COMMUNITY)
Admission: EM | Admit: 2016-03-18 | Discharge: 2016-03-18 | Disposition: A | Discharge status: Home / Self Care | Payer: BLUE CROSS/BLUE SHIELD | Attending: Emergency Medicine | Admitting: Emergency Medicine

## 2016-03-18 ENCOUNTER — Emergency Department (HOSPITAL_COMMUNITY): Payer: BLUE CROSS/BLUE SHIELD

## 2016-03-18 ENCOUNTER — Encounter (HOSPITAL_COMMUNITY): Payer: Self-pay | Admitting: *Deleted

## 2016-03-18 DIAGNOSIS — Y999 Unspecified external cause status: Secondary | ICD-10-CM | POA: Diagnosis not present

## 2016-03-18 DIAGNOSIS — Z87891 Personal history of nicotine dependence: Secondary | ICD-10-CM | POA: Diagnosis not present

## 2016-03-18 DIAGNOSIS — Y92410 Unspecified street and highway as the place of occurrence of the external cause: Secondary | ICD-10-CM | POA: Diagnosis not present

## 2016-03-18 DIAGNOSIS — M545 Low back pain: Secondary | ICD-10-CM

## 2016-03-18 DIAGNOSIS — Y939 Activity, unspecified: Secondary | ICD-10-CM | POA: Diagnosis not present

## 2016-03-18 MED ORDER — CYCLOBENZAPRINE HCL 10 MG PO TABS: 10.0000 mg | ORAL_TABLET | Freq: Two times a day (BID) | ORAL | Status: DC | PRN

## 2016-03-18 NOTE — ED Notes (Signed)
Returned from xray
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
## 2016-03-18 NOTE — ED Notes (Signed)
Pt changing into gown

## 2016-03-18 NOTE — ED Notes (Signed)
J Focht, PA, in w/pt. 
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
## 2016-04-04 ENCOUNTER — Ambulatory Visit: Payer: BLUE CROSS/BLUE SHIELD | Admitting: Adult Health

## 2016-04-18 ENCOUNTER — Telehealth: Payer: Self-pay | Admitting: Neurology

## 2016-04-18 NOTE — Telephone Encounter (Signed)
Pt called to advise that since stopping the tramadol per Dr Lacretia NicksW and now taking OTC meds she is having pain in legs and arms and back due to her job. She sts she needs something for the pain. Please call

## 2016-04-18 NOTE — Telephone Encounter (Signed)
Pt request to be called at work # [phone] 8:30 - 5pm

## 2016-04-18 NOTE — Telephone Encounter (Signed)
I called the patient. I left a message. The patient is not to take Ultram because of seizures, she has a history of substance abuse, cannot give her any controlled substance. She may take Mobic, or over-the-counter medication such as Advil or Aleve. In the past, Lyrica has been used to calls constipation. We could either increase the Effexor or use gabapentin.

## 2016-04-18 NOTE — Telephone Encounter (Signed)
Dr. Anne HahnWillis previously instructed pt not to take any Ultram as it could lower seizure threshold. Pt is requesting additional advice as OTC meds are not managing pain.

## 2016-04-18 NOTE — Telephone Encounter (Signed)
There is no non narcotic medication tried ? I recommend possibly using Mobic meloxicam for neck and joint pain. This will not lower the seizure threshold. Please ask patient if she has experience with this drug.

## 2016-04-27 MED ORDER — VENLAFAXINE HCL ER 150 MG PO CP24: 150.0000 mg | ORAL_CAPSULE | Freq: Every day | ORAL | 3 refills | Status: DC

## 2016-04-27 NOTE — Telephone Encounter (Signed)
I called patient. The patient is having ongoing discomfort, she cannot take Ultram secondary to seizures. We will go up on the Effexor taking 150 mg daily. The patient is not taking amitriptyline. I will call in a prescription for the Effexor.

## 2016-04-27 NOTE — Telephone Encounter (Signed)
Patient called requesting Dr. Anne HahnWillis call in Rx for pain cream or increase Effexor as discussed previously for pain in legs, carpal tunnel in both hands to [facility] [address], patient states, she works in a warehouse, "on legs and using hands all day".

## 2016-04-27 NOTE — Addendum Note (Signed)
Addended by: Stephanie AcreWILLIS, CHARLES on: 04/27/2016 05:24 PM   Modules accepted: Orders

## 2016-04-29 ENCOUNTER — Other Ambulatory Visit: Payer: Self-pay | Admitting: Neurology

## 2016-05-25 ENCOUNTER — Telehealth: Payer: Self-pay | Admitting: Neurology

## 2016-05-25 MED ORDER — MELOXICAM 15 MG PO TABS: 15.0000 mg | ORAL_TABLET | Freq: Every day | ORAL | 3 refills | Status: DC

## 2016-05-25 NOTE — Telephone Encounter (Signed)
Okay to refill Mobic

## 2016-05-25 NOTE — Telephone Encounter (Signed)
Patient requesting refill of meloxicam (MOBIC) 15 MG tablet Pharmacy: [id number] - [place], [place] - [address]

## 2016-06-05 ENCOUNTER — Other Ambulatory Visit: Payer: Self-pay

## 2016-06-05 MED ORDER — VENLAFAXINE HCL ER 150 MG PO CP24: 150.0000 mg | ORAL_CAPSULE | Freq: Every day | ORAL | 3 refills | Status: DC

## 2016-06-05 NOTE — Telephone Encounter (Signed)
90 day refills e-scribed on venlafaxine per faxed request [facility].

## 2016-06-20 ENCOUNTER — Telehealth: Payer: Self-pay | Admitting: Hematology

## 2016-06-20 ENCOUNTER — Encounter: Payer: Self-pay | Admitting: Hematology

## 2016-06-20 NOTE — Telephone Encounter (Signed)
Appt schedule w/Kale on 11/16 @11am . Pt aware to arrive 30 minutes early. Demographics verified. Pt says that [facility]. I explained to her to bring in the information so that it can be put in the system. Voiced understanding. Location given. Letter mailed to the pt.

## 2016-06-22 ENCOUNTER — Encounter: Payer: Self-pay | Admitting: Neurology

## 2016-06-22 ENCOUNTER — Ambulatory Visit (INDEPENDENT_AMBULATORY_CARE_PROVIDER_SITE_OTHER): Payer: BLUE CROSS/BLUE SHIELD | Admitting: Neurology

## 2016-06-22 VITALS — BP 98/62 | HR 48 | Ht 68.0 in | Wt 149.0 lb

## 2016-06-22 DIAGNOSIS — G40409 Other generalized epilepsy and epileptic syndromes, not intractable, without status epilepticus: Secondary | ICD-10-CM | POA: Diagnosis not present

## 2016-06-22 DIAGNOSIS — G5603 Carpal tunnel syndrome, bilateral upper limbs: Secondary | ICD-10-CM | POA: Diagnosis not present

## 2016-06-22 DIAGNOSIS — M545 Low back pain, unspecified: Secondary | ICD-10-CM

## 2016-06-22 DIAGNOSIS — M79604 Pain in right leg: Secondary | ICD-10-CM

## 2016-06-22 MED ORDER — DICLOFENAC SODIUM 1 % TD GEL: 2.0000 g | Freq: Three times a day (TID) | TRANSDERMAL | 3 refills | Status: DC | PRN

## 2016-06-22 NOTE — Patient Instructions (Signed)
   We will make a referral to an orthopedic surgeon for bilateral CTS.
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
## 2016-06-26 ENCOUNTER — Telehealth: Payer: Self-pay | Admitting: Neurology

## 2016-06-26 NOTE — Telephone Encounter (Signed)
Pt called in states her insurance is requesting more information as to why the pt needs diclofenac sodium (VOLTAREN) 1 % GEL. Please call and advise

## 2016-06-27 NOTE — Telephone Encounter (Signed)
Diclofenac gel PA [facility] [place] [facility]# [phone]. Rx. is for multiple joint pain, carpal tunnel syndrome.  She has tried and failed Meloxicam 15mg  po daily and otc Advil 200mg Chucky May/fim

## 2016-06-27 NOTE — Telephone Encounter (Signed)
[employer] called in to see if the office received the form for the medication. May call [phone]

## 2016-06-28 NOTE — Telephone Encounter (Signed)
[facility] [phone] called to check status of PA for diclofenac sodium (VOLTAREN) 1 % GEL, advised PA has been submitted, our office is awaiting response. Laurena SpiesMalia will advise patient.

## 2016-06-30 ENCOUNTER — Telehealth: Payer: Self-pay | Admitting: Neurology

## 2016-06-30 NOTE — Telephone Encounter (Signed)
Pt called in stating [facility] has not received her referral. Can another one be sent? Thank you

## 2016-06-30 NOTE — Telephone Encounter (Signed)
The diclofenac gel ordered was denied through the insurance company.

## 2016-07-03 NOTE — Telephone Encounter (Signed)
Sent fax x 2 attached with conformation . Called Patient and relayed I sent referral again to Dr. Merlyn LotKuzma . Patient understood details . Telephone [phone].

## 2016-07-14 ENCOUNTER — Encounter (HOSPITAL_COMMUNITY): Payer: Self-pay | Admitting: Emergency Medicine

## 2016-07-14 ENCOUNTER — Other Ambulatory Visit: Payer: Self-pay

## 2016-07-14 ENCOUNTER — Emergency Department (HOSPITAL_COMMUNITY)
Admission: EM | Admit: 2016-07-14 | Discharge: 2016-07-14 | Disposition: A | Discharge status: Home / Self Care | Payer: 59 | Attending: Emergency Medicine | Admitting: Emergency Medicine

## 2016-07-14 DIAGNOSIS — Z87891 Personal history of nicotine dependence: Secondary | ICD-10-CM | POA: Diagnosis not present

## 2016-07-14 DIAGNOSIS — Z79899 Other long term (current) drug therapy: Secondary | ICD-10-CM | POA: Diagnosis not present

## 2016-07-14 DIAGNOSIS — L509 Urticaria, unspecified: Secondary | ICD-10-CM | POA: Diagnosis present

## 2016-07-14 MED ORDER — FAMOTIDINE IN NACL 20-0.9 MG/50ML-% IV SOLN
20.0000 mg | Freq: Once | INTRAVENOUS | Status: AC
  Administered 2016-07-14: 20 mg via INTRAVENOUS
  Filled 2016-07-14: qty 50

## 2016-07-14 MED ORDER — DIPHENHYDRAMINE HCL 50 MG/ML IJ SOLN
25.0000 mg | Freq: Once | INTRAMUSCULAR | Status: AC
  Administered 2016-07-14: 25 mg via INTRAVENOUS
  Filled 2016-07-14: qty 1

## 2016-07-14 NOTE — ED Provider Notes (Signed)
[facility]-EMERGENCY DEPT Provider Note   CSN: [id number] Arrival date & time: 07/14/16  1017     History   Chief Complaint Chief Complaint  Patient presents with  . Allergic Reaction    HPI Pamela [name] is a 55 y.o. female. She presents for evaluation for hives and possible allergic reaction. Patient has history of carpal tunnel syndrome. She received a "cortisone injection" into her right wrist 2 days ago. Last night felt some itching. Felt worse this morning with itching and at work thought she was having some trouble breathing. She presents very anxious with a normal airway and suspect some of this may be secondary to some anxiety. However clearly has some small areas of urticaria as well.  HPI  Past Medical History:  Diagnosis Date  . Anxiety    takes Effexor  . Bilateral carpal tunnel syndrome 12/22/2015  . Bronchitis   . Depression   . 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 Family history of cancer    Pt reports family hx of breast, colon, throat and prostate, is watched closely but no diagnosis  . GERD (gastroesophageal reflux disease)    Pt reports chest pain about 5 years ago but GERD was contributing factor.  Marland Kitchen. Heart murmur    Pt denies seeing a cardiologist on a regular basis, heart studies done over 20 years ago.  Marland Kitchen. History of hiatal hernia   . Hx of degenerative disc disease   . Low back pain radiating to right leg 12/22/2015  . Pneumonia   . Scoliosis   . Seizures (HCC)    epilepsy, started at age 55  . Shortness of breath dyspnea   . 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    Patient Active Problem List   Diagnosis Date Noted  . Bilateral carpal tunnel syndrome 12/22/2015  . Low back pain radiating to right leg 12/22/2015  . Epilepsy (HCC) 11/30/2015    Past Surgical History:  Procedure Laterality Date  . ABDOMINAL HYSTERECTOMY    . BACK SURGERY     scoliosis  . EYE SURGERY     tear duct surgery  . LUMBAR  LAMINECTOMY/DECOMPRESSION MICRODISCECTOMY N/A 07/29/2014   Procedure: LUMBAR LAMINECTOMY/DECOMPRESSION MICRODISCECTOMY;  Surgeon: Emilee HeroMark Leonard Dumonski, MD;  [facility];  Service: Orthopedics;  Laterality: N/A;  Lumbar 4-5 decompression  . removed cysts from bilateral ovaries Bilateral 2010  . SHOULDER SURGERY Right    Removed bone spurs    OB History    No data available       Home Medications    Prior to Admission medications   Medication Sig Start Date End Date Taking? Authorizing Provider  diphenhydramine-acetaminophen (TYLENOL PM) 25-500 MG TABS tablet Take 1 tablet by mouth at bedtime as needed (sleep).   Yes Historical Provider, MD  levETIRAcetam (KEPPRA) 500 MG tablet Take 1 tablet (500 mg total) by mouth 2 (two) times daily. 05/01/16  Yes York Spanielharles K Willis, MD  meloxicam (MOBIC) 15 MG tablet Take 1 tablet (15 mg total) by mouth daily. Patient taking differently: Take 15 mg by mouth at bedtime.  05/25/16  Yes York Spanielharles K Willis, MD  Multiple Vitamin (MULTIVITAMIN WITH MINERALS) TABS Take 1 tablet by mouth daily.   Yes Historical Provider, MD  Nutritional Supplements (EQUATE PO) Take 1 lozenge by mouth at bedtime as needed (smoking cessation).   Yes Historical Provider, MD  Probiotic Product (PROBIOTIC DAILY PO) Take 1 capsule by mouth daily.   Yes  Historical Provider, MD  venlafaxine XR (EFFEXOR XR) 150 MG 24 hr capsule Take 1 capsule (150 mg total) by mouth daily with breakfast. 06/05/16  Yes York Spaniel, MD  acetaminophen (TYLENOL) 325 MG tablet Take 325-650 mg by mouth every 6 (six) hours as needed for mild pain, fever or headache.    Historical Provider, MD  cyclobenzaprine (FLEXERIL) 10 MG tablet Take 1 tablet (10 mg total) by mouth 2 (two) times daily as needed for muscle spasms. 03/18/16   Jerre Simon, PA  diclofenac sodium (VOLTAREN) 1 % GEL Apply 2 g topically 3 (three) times daily as needed. Patient not taking: Reported on 07/14/2016 06/22/16   York Spaniel,  MD    Family History Family History  Problem Relation Age of Onset  . Prostate cancer Father   . Diabetes Father   . Breast cancer Mother   . Prostate cancer Brother   . Hepatitis C Brother   . Seizures Neg Hx     Social History Social History  Substance Use Topics  . Smoking status: Former Smoker    Types: Cigarettes    Quit date: 03/07/2009  . Smokeless tobacco: Never Used  . Alcohol use No     Comment: history of abuse, sober for 21 years     Allergies   Prednisone and Zoloft [sertraline hcl]   Review of Systems Review of Systems  Constitutional: Negative for appetite change, chills, diaphoresis, fatigue and fever.  HENT: Negative for mouth sores, sore throat and trouble swallowing.   Eyes: Negative for visual disturbance.  Respiratory: Negative for cough, chest tightness, shortness of breath and wheezing.   Cardiovascular: Negative for chest pain.  Gastrointestinal: Negative for abdominal distention, abdominal pain, diarrhea, nausea and vomiting.  Endocrine: Negative for polydipsia, polyphagia and polyuria.  Genitourinary: Negative for dysuria, frequency and hematuria.  Musculoskeletal: Negative for gait problem.  Skin: Negative for color change, pallor and rash.       Hives  Neurological: Negative for dizziness, syncope, light-headedness and headaches.  Hematological: Does not bruise/bleed easily.  Psychiatric/Behavioral: Negative for behavioral problems and confusion.     Physical Exam Updated Vital Signs Pulse 60   Temp (!) 96.9 F (36.1 C) (Axillary)   Resp 16   SpO2 100%   Physical Exam  Constitutional: She is oriented to person, place, and time. She appears well-developed and well-nourished. No distress.  HENT:  Head: Normocephalic.  Normal airway. Pharynx normal. No edema. Uvula midline. No swelling. No stridor. No wheezing. Clear lungs.  Eyes: Conjunctivae are normal. Pupils are equal, round, and reactive to light. No scleral icterus.  Neck:  Normal range of motion. Neck supple. No thyromegaly present.  Cardiovascular: Normal rate and regular rhythm.  Exam reveals no gallop and no friction rub.   No murmur heard. Pulmonary/Chest: Effort normal and breath sounds normal. No respiratory distress. She has no wheezes. She has no rales.  Abdominal: Soft. Bowel sounds are normal. She exhibits no distension. There is no tenderness. There is no rebound.  Musculoskeletal: Normal range of motion.  Neurological: She is alert and oriented to person, place, and time.  Skin: Skin is warm and dry. No rash noted.     Psychiatric: Her behavior is normal. Her mood appears anxious.     ED Treatments / Results  Labs (all labs ordered are listed, but only abnormal results are displayed) Labs Reviewed - No data to display  EKG  EKG Interpretation None       Radiology  No results found.  Procedures Procedures (including critical care time)  Medications Ordered in ED Medications  diphenhydrAMINE (BENADRYL) injection 25 mg (25 mg Intravenous Given 07/14/16 1049)  famotidine (PEPCID) IVPB 20 mg premix (0 mg Intravenous Stopped 07/14/16 1130)     Initial Impression / Assessment and Plan / ED Course  I have reviewed the triage vital signs and the nursing notes.  Pertinent labs & imaging results that were available during my care of the patient were reviewed by me and considered in my medical decision making (see chart for details).  Clinical Course    Observed over an hour. Given Benadryl and Pepcid. Urticaria resolved. No airway symptoms. Anxiety resolved. Plan is home, Benadryl, Pepcid. Recheck here with any worsening or recurrence of symptoms  Final Clinical Impressions(s) / ED Diagnoses   Final diagnoses:  Hives    New Prescriptions New Prescriptions   No medications on file     Rolland PorterMark Kelcey Wickstrom, MD 07/14/16 1207

## 2016-07-14 NOTE — Discharge Instructions (Signed)
Benadryl every 6 hours as needed for hives or itching.  Return to ER with any worsening symptoms including difficulty breathing or swallowing

## 2016-07-14 NOTE — ED Triage Notes (Addendum)
Pt c/o allergic reaction to prednisone shot she received on Wednesday. Reports pruritis, tinnitus, hives to chest and hands. Pt hyperventilating, out of breath. Lungs clear, no edema. Now patient reports numbness to face and appears to be having anxiety reaction.

## 2016-07-14 NOTE — ED Notes (Signed)
PT DISCHARGED. INSTRUCTIONS GIVEN. AAOX4. PT IN NO APPARENT DISTRESS OR PAIN. THE OPPORTUNITY TO ASK QUESTIONS WAS PROVIDED. 

## 2016-07-14 NOTE — ED Notes (Signed)
Report given to Adrian

## 2016-07-20 ENCOUNTER — Ambulatory Visit: Payer: No Typology Code available for payment source | Admitting: Hematology

## 2016-08-30 ENCOUNTER — Other Ambulatory Visit: Payer: Self-pay

## 2016-08-30 MED ORDER — MELOXICAM 15 MG PO TABS: 15.0000 mg | ORAL_TABLET | Freq: Every day | ORAL | 3 refills | Status: DC

## 2016-08-30 MED ORDER — VENLAFAXINE HCL ER 150 MG PO CP24: 150.0000 mg | ORAL_CAPSULE | Freq: Every day | ORAL | 3 refills | Status: DC

## 2016-08-30 MED ORDER — LEVETIRACETAM 500 MG PO TABS: 500.0000 mg | ORAL_TABLET | Freq: Two times a day (BID) | ORAL | 3 refills | Status: DC

## 2016-08-30 NOTE — Telephone Encounter (Signed)
RXs e-scribed to mail order pharmacy per faxed request.

## 2016-10-03 ENCOUNTER — Telehealth: Payer: Self-pay | Admitting: Neurology

## 2016-10-09 MED ORDER — VENLAFAXINE HCL ER 150 MG PO CP24: 150.0000 mg | ORAL_CAPSULE | Freq: Every day | ORAL | 3 refills | Status: DC

## 2016-10-09 MED ORDER — MELOXICAM 15 MG PO TABS: 15.0000 mg | ORAL_TABLET | Freq: Every day | ORAL | 3 refills | Status: DC

## 2016-10-09 MED ORDER — LEVETIRACETAM 500 MG PO TABS
500.0000 mg | ORAL_TABLET | Freq: Two times a day (BID) | ORAL | 3 refills | Status: DC
Start: 2016-10-09 — End: 2017-06-27

## 2016-10-09 NOTE — Telephone Encounter (Signed)
Pt called said she has not rec'd the medication nor has [facility] reached out to her. Pt said she is not going to do business with that pharmacy and is [facility] [address]

## 2016-10-09 NOTE — Telephone Encounter (Signed)
I have sent in prescriptions to the local pharmacy.

## 2016-10-09 NOTE — Addendum Note (Signed)
Addended by: Stephanie AcreWILLIS, Danice Dippolito on: 10/09/2016 04:39 PM   Modules accepted: Orders

## 2016-10-09 NOTE — Telephone Encounter (Signed)
Called patient. She stated she wanted the venlafaxine, Keppra and meloxicam to be filled at her local pharmacy. Not [facility].  She no longer wants to go through [facility]. She is not happy with their services. I relayed message below.  She is out of the meloxicam. She would like [facility]ills [facility] to  [id number] [place], [place] - [address] [phone] (Phone) [phone] (Fax)   Advised I will send message to Dr Anne HahnWillis. Advised her to call back if she has further questions or concerns.

## 2016-10-09 NOTE — Telephone Encounter (Signed)
Called [facility] and spoke to representative Jodie Echevaria(Tan) She ran medication through patient's medication through insurance.  (keppra, meloxicam, and venlafaxine XR) Advised pt called and stated she has not received medication and no one has contacted her.   Reason: keppra cannot fill until 10/23/16 and venlafaxine April 2nd, 2018 because she recently got them filled at a local pharamcy. She stated they can send her Meloxicam still. Advised I need to speak with pt first and see how she would like to proceed. She placed meloxicam on hold.

## 2016-11-08 NOTE — Telephone Encounter (Signed)
Pt called today needing to know the name of mail order pharmacy. She said they are saying medication is being shipped out and signed for but she said she has not rec'd any medication from them. I gave her [facility] (p) [phone]  [facility]

## 2016-12-21 ENCOUNTER — Encounter: Payer: Self-pay | Admitting: Adult Health

## 2016-12-21 ENCOUNTER — Encounter (INDEPENDENT_AMBULATORY_CARE_PROVIDER_SITE_OTHER): Payer: Self-pay

## 2016-12-21 ENCOUNTER — Ambulatory Visit (INDEPENDENT_AMBULATORY_CARE_PROVIDER_SITE_OTHER): Payer: 59 | Admitting: Adult Health

## 2016-12-21 VITALS — BP 98/63 | HR 62 | Ht 68.0 in | Wt 154.8 lb

## 2016-12-21 DIAGNOSIS — G5603 Carpal tunnel syndrome, bilateral upper limbs: Secondary | ICD-10-CM

## 2016-12-21 DIAGNOSIS — R569 Unspecified convulsions: Secondary | ICD-10-CM | POA: Diagnosis not present

## 2016-12-21 DIAGNOSIS — G8929 Other chronic pain: Secondary | ICD-10-CM

## 2016-12-21 DIAGNOSIS — M545 Low back pain: Secondary | ICD-10-CM

## 2016-12-21 NOTE — Patient Instructions (Signed)
Continue Keppra Continue Effexor and Flexeril  Smoking Cessation: [phone] If you have any seizure events please let us know.

## 2016-12-21 NOTE — Progress Notes (Signed)
I have read the note, and I agree with the clinical assessment and plan.  Pamela [name],Pamela [name]   
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
## 2017-01-31 ENCOUNTER — Telehealth: Payer: Self-pay | Admitting: Adult Health

## 2017-01-31 DIAGNOSIS — G8929 Other chronic pain: Secondary | ICD-10-CM

## 2017-01-31 DIAGNOSIS — M545 Low back pain: Principal | ICD-10-CM

## 2017-01-31 NOTE — Addendum Note (Signed)
Addended by: Enedina FinnerMILLIKAN, Blessin Kanno P on: 01/31/2017 04:24 PM   Modules accepted: Orders

## 2017-01-31 NOTE — Telephone Encounter (Signed)
Pt said when she last met with Jinny Blossom she was told to line things up with her insurance re: her request for a back brace(the name of the one that she has is Posture control brace, she needs a size med or small either a 34 or 35) and then to call back.  Pt said she has spoken with her insurance company(which is about to expire/run out on tomorrow 5-31) and she has been told that it needs to be on record today that she has submitted a request by today for the approval of the back brace that she has spoken to [facility] about requesting for her lower back pain.   Pt asking to be called on work# and they can get the call directly to her.  If call is after 5:00 please call her mobile#

## 2017-01-31 NOTE — Telephone Encounter (Signed)
error 

## 2017-01-31 NOTE — Telephone Encounter (Signed)
Spoke to pt and faxed per pts request the order for postural control brace. [facility] [phone], and fax [phone] with fax confirmation. [employer] and [employer] file insurance.

## 2017-01-31 NOTE — Telephone Encounter (Signed)
Order printed and given to Sandy RN 

## 2017-03-28 NOTE — Telephone Encounter (Signed)
Patient calling stating [facility] does not take insurance for a back brace.

## 2017-03-28 NOTE — Telephone Encounter (Signed)
Spoke with patient to advise her she should contact other medical equipment companies, tell them what type insurance she has and ask if they would cover brace. She stated she has been to all the ones she knows about, and no one takes her insurance.  She could not tell this RN what companies she has been to. She stated she would find out and call back.

## 2017-03-29 NOTE — Telephone Encounter (Signed)
LVM advising patient that if she has not tried getting DME from [facility] or [facility], this RN has addresses, phone numbers. Advised patient will have information in phone note, so phone staff can advise her.   [facility]  [address]  [phone]  [employer] [address] [phone]

## 2017-03-30 NOTE — Telephone Encounter (Signed)
Attempted to reach patient again today.  Left message for a return call.  Please provide information below when she calls back.

## 2017-04-02 ENCOUNTER — Encounter: Payer: Self-pay | Admitting: *Deleted

## 2017-04-02 NOTE — Telephone Encounter (Signed)
Mailed letter to patient with copy of DME order and contact information for [facility] and [facility].

## 2017-04-09 NOTE — Telephone Encounter (Signed)
Pt is calling back to inform that [phone] needs to be contacted because they have the back brace with straps that she needs.  They just need the prescription sent over to them.  Pt did not have fax# but is asking they are called re: the need of her back brace with straps

## 2017-04-09 NOTE — Telephone Encounter (Signed)
Can you call [employer] and get the name of the brace and their fax number and I will send over prescription.

## 2017-04-09 NOTE — Telephone Encounter (Signed)
Pt need [facility] rx for back brace...please advise

## 2017-04-10 NOTE — Addendum Note (Signed)
Addended by: Enedina FinnerMILLIKAN, Dain Laseter P on: 04/10/2017 03:05 PM   Modules accepted: Orders

## 2017-04-10 NOTE — Telephone Encounter (Signed)
Order printed/signed. Given to [facility], [place]

## 2017-04-10 NOTE — Telephone Encounter (Signed)
[employer] stated since pt have multiple diagnosed for back pain is kind a hard to figure which brace is needed. [facility] also advise to write evaluate on the Rx with diagnoses(scoliosis, generative disc disease, ect) to figure for  correct brace.

## 2017-06-27 ENCOUNTER — Ambulatory Visit (INDEPENDENT_AMBULATORY_CARE_PROVIDER_SITE_OTHER): Payer: Managed Care, Other (non HMO) | Admitting: Adult Health

## 2017-06-27 ENCOUNTER — Encounter: Payer: Self-pay | Admitting: Adult Health

## 2017-06-27 ENCOUNTER — Encounter (INDEPENDENT_AMBULATORY_CARE_PROVIDER_SITE_OTHER): Payer: Self-pay

## 2017-06-27 VITALS — BP 88/59 | HR 56 | Ht 67.0 in | Wt 147.0 lb

## 2017-06-27 DIAGNOSIS — G8929 Other chronic pain: Secondary | ICD-10-CM | POA: Diagnosis not present

## 2017-06-27 DIAGNOSIS — M25512 Pain in left shoulder: Secondary | ICD-10-CM

## 2017-06-27 DIAGNOSIS — R569 Unspecified convulsions: Secondary | ICD-10-CM

## 2017-06-27 DIAGNOSIS — M25511 Pain in right shoulder: Secondary | ICD-10-CM

## 2017-06-27 DIAGNOSIS — M5441 Lumbago with sciatica, right side: Secondary | ICD-10-CM

## 2017-06-27 MED ORDER — LEVETIRACETAM 500 MG PO TABS
500.0000 mg | ORAL_TABLET | Freq: Two times a day (BID) | ORAL | 3 refills | Status: DC
Start: 2017-06-27 — End: 2017-09-28

## 2017-06-27 NOTE — Patient Instructions (Addendum)
Your Plan:  Continue Keppra Continue  Effexor Use flexeril for muscle spasms PT for back pain Take Mobic in the morning If your symptoms worsen or you develop new symptoms please let us know.    Thank you for coming to see [facility] at [facility]. I hope we have been able to provide you high quality care today.  You may receive a patient satisfaction survey over the next few weeks. We would appreciate your feedback and comments so that we may continue to improve ourselves and the health of our patients.

## 2017-06-27 NOTE — Progress Notes (Signed)
PATIENT: Pamela [name] DOB: Mar 31, 1961  REASON FOR VISIT: follow up HISTORY FROM: patient  HISTORY OF PRESENT ILLNESS:  Today 06/27/17  Pamela [name] is a 56 year old female with a history of seizures, bilateral carpal tunnel syndrome and low back pain. She returns today for follow-up. She denies any seizure events. She remains on Keppra and is tolerating it well. She is able to complete all ADLs independently. She operates a Librarian, academic without difficulty. She continues to manage her low back pain with Effexor and Flexeril. He states recently her back pain has began to worsen. She states that typically is on the right side but can radiate over to the left side. She does have some pain that goes down the right leg. She denies any numbness or tingling. She reports that she also has joint pain in both shoulders. She is mobic however she takes this at bedtime. She uses the Flexeril only as needed for muscle spasms. The patient reports that she has a Harrington rod and states that she is not a candidate for MRI scan. She states that her carpal tunnel is managed by Dr. Merlyn Lot. She denies any new symptoms. She returns today for follow-up.  HISTORY 12/21/16:Pamela [name] is a 56 year old female with a history of seizures, low back pain in the bilateral carpal tunnel syndrome. She returns today for follow-up. She remains on Keppra. Reports that she is not had any seizures in several years. She did follow up with Dr. Merlyn Lot for carpal tunnel reports that she was given injections and a topical cream to use. She reports that she is trying to avoid surgery at this time. She reports that she is able to treat her back pain with Effexor and Flexeril. She states that when she does have a flareup it usually requires her to rest in order to get relief. Overall she feels that she is doing well. She returns today for an evaluation.   REVIEW OF SYSTEMS: Out of a complete 14 system review of symptoms, the patient  complains only of the following symptoms, and all other reviewed systems are negative.  See HPI  ALLERGIES: Allergies  Allergen Reactions  . Equate Nicotine [Nicotine]     Lozenges,jittery and rash, breathing problems  . Zoloft [Sertraline Hcl]     lethargic    HOME MEDICATIONS: Outpatient Medications Prior to Visit  Medication Sig Dispense Refill  . acetaminophen (TYLENOL) 325 MG tablet Take 325-650 mg by mouth every 6 (six) hours as needed for mild pain, fever or headache.    . cyclobenzaprine (FLEXERIL) 10 MG tablet Take 1 tablet (10 mg total) by mouth 2 (two) times daily as needed for muscle spasms. 20 tablet 0  . diclofenac sodium (VOLTAREN) 1 % GEL Apply 2 g topically 3 (three) times daily as needed. 1 Tube 3  . diphenhydramine-acetaminophen (TYLENOL PM) 25-500 MG TABS tablet Take 1 tablet by mouth at bedtime as needed (sleep).    Marland Kitchen levETIRAcetam (KEPPRA) 500 MG tablet Take 1 tablet (500 mg total) by mouth 2 (two) times daily. 180 tablet 3  . meloxicam (MOBIC) 15 MG tablet Take 1 tablet (15 mg total) by mouth daily. 90 tablet 3  . Multiple Vitamin (MULTIVITAMIN WITH MINERALS) TABS Take 1 tablet by mouth daily.    . Probiotic Product (PROBIOTIC DAILY PO) Take 1 capsule by mouth daily.    Marland Kitchen venlafaxine XR (EFFEXOR XR) 150 MG 24 hr capsule Take 1 capsule (150 mg total) by mouth daily with breakfast. 90 capsule  3   No facility-administered medications prior to visit.     PAST MEDICAL HISTORY: Past Medical History:  Diagnosis Date  . Anxiety    takes Effexor  . Bilateral carpal tunnel syndrome 12/22/2015  . Bronchitis   . Depression   . 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 Family history of cancer    Pt reports family hx of breast, colon, throat and prostate, is watched closely but no diagnosis  . GERD (gastroesophageal reflux disease)    Pt reports chest pain about 5 years ago but GERD was contributing factor.  Marland Kitchen. Heart murmur    Pt denies seeing a cardiologist on a regular basis,  heart studies done over 20 years ago.  Marland Kitchen. History of hiatal hernia   . Hx of degenerative disc disease   . Low back pain radiating to right leg 12/22/2015  . Pneumonia   . Scoliosis   . Seizures (HCC)    epilepsy, started at age 56  . Shortness of breath dyspnea   . 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    PAST SURGICAL HISTORY: Past Surgical History:  Procedure Laterality Date  . ABDOMINAL HYSTERECTOMY    . BACK SURGERY     scoliosis  . EYE SURGERY     tear duct surgery  . LUMBAR LAMINECTOMY/DECOMPRESSION MICRODISCECTOMY N/A 07/29/2014   Procedure: LUMBAR LAMINECTOMY/DECOMPRESSION MICRODISCECTOMY;  Surgeon: Emilee HeroMark Leonard Dumonski, MD;  [facility];  Service: Orthopedics;  Laterality: N/A;  Lumbar 4-5 decompression  . removed cysts from bilateral ovaries Bilateral 2010  . SHOULDER SURGERY Right    Removed bone spurs    FAMILY HISTORY: Family History  Problem Relation Age of Onset  . Prostate cancer Father   . Diabetes Father   . Breast cancer Mother   . Prostate cancer Brother   . Hepatitis C Brother   . Seizures Neg Hx     SOCIAL HISTORY: Social History   Social History  . Marital status: Single    Spouse name: N/A  . Number of children: N/A  . Years of education: N/A   Occupational History  . Not on file.   Social History Main Topics  . Smoking status: Current Every Day Smoker    Packs/day: 1.00    Types: Cigarettes  . Smokeless tobacco: Never Used  . Alcohol use No     Comment: history of abuse, sober for 21 years  . 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 Sexual activity: Not on file   Other Topics Concern  . Not on file   Social History Narrative   Lives at home alone.  Works at [employer].  Has some college.  No children.     Right-handed   Caffeine: 2-3 cups of coffee per day      PHYSICAL EXAM  Vitals:   06/27/17 0752  BP: (!) 88/59    Pulse: (!) 56  Weight: 147 lb (66.7 kg)  Height: 5\' 7"  (1.702 m)   Body mass index is 23.02 kg/m.  Generalized: Well developed, in no acute distress   Neurological examination  Mentation: Alert oriented to time, place, history taking. Follows all commands speech and language fluent Cranial nerve II-XII: Pupils were equal round reactive to light. Extraocular movements were full, visual field were full on confrontational test. Facial sensation and strength were normal. Uvula tongue midline. Head turning and  shoulder shrug  were normal and symmetric. Motor: The motor testing reveals 5 over 5 strength of all 4 extremities. Good symmetric motor tone is noted throughout.  Sensory: Sensory testing is intact to soft touch on all 4 extremities. No evidence of extinction is noted.  Coordination: Cerebellar testing reveals good finger-nose-finger and heel-to-shin bilaterally.  Gait and station: Gait is normal. Tandem gait is normal. Romberg is negative. No drift is seen.  Reflexes: Deep tendon reflexes are symmetric and normal bilaterally.   DIAGNOSTIC DATA (LABS, IMAGING, TESTING) - I reviewed patient records, labs, notes, testing and imaging myself where available.  Lab Results  Component Value Date   WBC 3.2 (L) 07/22/2014   HGB 11.5 (L) 07/22/2014   HCT 34.7 (L) 07/22/2014   MCV 95.3 07/22/2014   PLT 169 07/22/2014      Component Value Date/Time   NA 144 07/22/2014 1044   K 4.1 07/22/2014 1044   CL 107 07/22/2014 1044   CO2 26 07/22/2014 1044   GLUCOSE 82 07/22/2014 1044   BUN 11 07/22/2014 1044   CREATININE 0.68 07/22/2014 1044   CALCIUM 9.1 07/22/2014 1044   PROT 6.4 07/22/2014 1044   ALBUMIN 3.5 07/22/2014 1044   AST 14 07/22/2014 1044   ALT 10 07/22/2014 1044   ALKPHOS 75 07/22/2014 1044   BILITOT <0.2 (L) 07/22/2014 1044   GFRNONAA >90 07/22/2014 1044   GFRAA >90 07/22/2014 1044      ASSESSMENT AND PLAN 56 y.o. year old female  has a past medical history of  Anxiety; Bilateral carpal tunnel syndrome (12/22/2015); Bronchitis; Depression; Epilepsy (HCC) (11/30/2015); Family history of cancer; GERD (gastroesophageal reflux disease); Heart murmur; History of hiatal hernia; degenerative disc disease; Low back pain radiating to right leg (12/22/2015); Pneumonia; Scoliosis; Seizures (HCC); Shortness of breath dyspnea; and Substance abuse. here with :  1. Seizures 2. Chronic back pain 3. Carpal tunnel syndrome bilaterally  The patient will continue on Keppra for seizures. She is advised to let us know if she has any seizure events. I have advised patient to begin taking Mobic in the morning to see if this gives her any additional benefit for her shoulder pain as well as back pain. She will also be sent to physical therapy for her back pain. She is currently taking Flexeril only for muscle spasms. In the future this may have to be increased if she continues to have pain. The patient is not a candidate for narcotic medication due to her history of substance abuse. She is advised that if her symptoms worsen or she develops new symptoms she should let us know. She will follow-up in 6 months or sooner if needed.     Butch Penny, MSN, NP-C 06/27/2017, 7:42 AM [facility] [address] [place], [place] [address] [phone]

## 2017-06-27 NOTE — Progress Notes (Signed)
I have read the note, and I agree with the clinical assessment and plan.  Pamela [name],Pamela [name]   

## 2017-07-12 ENCOUNTER — Ambulatory Visit: Payer: Managed Care, Other (non HMO) | Attending: Adult Health | Admitting: Physical Therapy

## 2017-07-12 ENCOUNTER — Telehealth (INDEPENDENT_AMBULATORY_CARE_PROVIDER_SITE_OTHER): Payer: Self-pay | Admitting: Orthopaedic Surgery

## 2017-07-12 DIAGNOSIS — R293 Abnormal posture: Secondary | ICD-10-CM

## 2017-07-12 DIAGNOSIS — M5441 Lumbago with sciatica, right side: Secondary | ICD-10-CM | POA: Insufficient documentation

## 2017-07-12 DIAGNOSIS — G8929 Other chronic pain: Secondary | ICD-10-CM

## 2017-07-12 DIAGNOSIS — M6281 Muscle weakness (generalized): Secondary | ICD-10-CM | POA: Insufficient documentation

## 2017-07-12 DIAGNOSIS — R262 Difficulty in walking, not elsewhere classified: Secondary | ICD-10-CM | POA: Insufficient documentation

## 2017-07-12 NOTE — Telephone Encounter (Signed)
Call this number between *8:30 am-5:00pm*  Please call pt on work phone if an appt opens up

## 2017-07-12 NOTE — Patient Instructions (Addendum)
Leg Extension (Hamstring)  Choose one exercise to do 3 x a day   Sit toward front edge of chair, with leg out straight, heel on floor, toes pointing toward body. Keeping back straight, bend forward at hip, breathing out through pursed lips. Return, breathing in. Repeat ___ times. Repeat with other leg. Do _2-3__ sessions per day. Variation: Perform from standing position, with support.  Abduction: Clam (Eccentric) - Side-Lying   Lie on side with knees bent. Lift top knee, keeping feet together. Keep trunk steady and quiet. Separate knees apart keeping fee/ankles together   15 ___ reps per set, __2_ sets per day, _7__ days per week.  Both right and left .  Copyright  VHI. All rights reserved.   Garen LahLawrie Beardsley, PT Certified Exercise Expert for the Aging Adult  07/12/17 4:18 PM Phone: [phone] Fax: [phone]

## 2017-07-12 NOTE — Therapy (Signed)
[facility] [address] [place], [place], [address] Phone: [phone]   Fax:  [phone]  Physical Therapy Evaluation  Patient Details  Name: Pamela AmorJacqueline D [name] MRN: [id number] Date of Birth: 1960/10/20 Referring Provider: Butch PennyMillikan, Megan NP   Encounter Date: 07/12/2017  PT End of Session - 07/12/17 1554    Visit Number  1    Number of Visits  13    Date for PT Re-Evaluation  08/23/17    Authorization Type  [employer]    PT Start Time  0345    PT Stop Time  0440    PT Time Calculation (min)  55 min    Activity Tolerance  Patient tolerated treatment well    Behavior During Therapy  [facility] for tasks assessed/performed       Past Medical History:  Diagnosis Date  . Anxiety    takes Effexor  . Bilateral carpal tunnel syndrome 12/22/2015  . Bronchitis   . Depression   . 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 Family history of cancer    Pt reports family hx of breast, colon, throat and prostate, is watched closely but no diagnosis  . GERD (gastroesophageal reflux disease)    Pt reports chest pain about 5 years ago but GERD was contributing factor.  Marland Kitchen. Heart murmur    Pt denies seeing a cardiologist on a regular basis, heart studies done over 20 years ago.  Marland Kitchen. History of hiatal hernia   . Hx of degenerative disc disease   . Low back pain radiating to right leg 12/22/2015  . Pneumonia   . Scoliosis   . Seizures (HCC)    epilepsy, started at age 56  . Shortness of breath dyspnea   . 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    Past Surgical History:  Procedure Laterality Date  . ABDOMINAL HYSTERECTOMY    . BACK SURGERY     scoliosis  . EYE SURGERY     tear duct surgery  . removed cysts from bilateral ovaries Bilateral 2010  . SHOULDER SURGERY Right    Removed bone spurs    There were no vitals filed for this visit.   Subjective Assessment - 07/12/17 1604    Subjective  I have had  chronic back pain 3-5 years.  I have carpal tunnel in both hands. Dr Merlyn LotKuzma is going to send me to Dr. Ophelia CharterYates for  cervicalgia.  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  I got harrington rods 42 years ago for scoliosis    Pertinent History  carpal tunnel, Scoliosis, DDD  Scoliosis with Harrington rod place T5 to L2, epilepsy,  See Medical hx     Limitations  Sitting;Standing;Walking;Lifting;House hold activities    How long can you sit comfortably?  45 minutues is max i can sit    How long can you stand comfortably?  30 minutes    How long can you walk comfortably?  30 minutes    Diagnostic tests  x rays one year ago.  current ones ordered    Patient Stated Goals  relief from pain, pain management  strategies    Currently in Pain?  Yes    Pain Score  8     Pain Location  Back    Pain Orientation  Right    Pain Descriptors /  Indicators  Throbbing;Shooting    Pain Type  Chronic pain    Pain Onset  More than a month ago    Pain Frequency  Constant    Aggravating Factors   cold weather, standing for longer than 30 minutes,  bending over at my job    Pain Relieving Factors  heating pad, try to swim 2 x a week         [facility] PT Assessment - 07/12/17 1606      Assessment   Medical Diagnosis  chronic right sided back pain with right sided right leg down to feet at night    Referring Provider  Butch PennyMillikan, Megan NP    Onset Date/Surgical Date  --  last 3-5 years. exacerbating in last year    Hand Dominance  Right    Next MD Visit  Next at Dr. Ophelia CharterYates on 08-07-17    Prior Therapy  2 years ago      Precautions   Precautions  Back    Precaution Comments  lifting more than 20 lbs    Spinal Brace  Lumbar corset soft brace      Restrictions   Weight Bearing Restrictions  No      Balance Screen   Has the patient fallen in the past 6 months  No    Has the patient had a decrease in activity level because of a fear of  falling?   No    Is the patient reluctant to leave their home because of a fear of falling?   No      Home Environment   Living Environment  Private residence    Living Arrangements  Alone    Type of Home  Apartment    Home Access  Stairs to enter    Entrance Stairs-Number of Steps  1 small step    Home Layout  One level      Prior Function   Level of Independence  Independent    Vocation  Full time employment    Vocation Requirements  standing and cutting leather and walking on cement    Leisure  aquatics for exercise      Cognition   Overall Cognitive Status  Within Functional Limits for tasks assessed      Observation/Other Assessments   Observations  enters clinic    Focus on Therapeutic Outcomes (FOTO)   FOTO intake 45%  limitatin 555  predicted 46%      Sensation   Light Touch  Appears Intact      Posture/Postural Control   Posture/Postural Control  Postural limitations    Postural Limitations  Forward head;Increased thoracic kyphosis;Weight shift left    Posture Comments  scolisis harrington rod placement  elevated right pelvic obliquity      ROM / Strength   AROM / PROM / Strength  AROM;Strength      AROM   Overall AROM   Deficits    Lumbar Flexion  60    Lumbar Extension  10    Lumbar - Right Side Bend  9 pain on right    Lumbar - Left Side Bend  10 pain on right    Lumbar - Right Rotation  limited 25%    Lumbar - Left Rotation  limited 50 % may be end range due to Harrington rod placement      Strength   Overall Strength  Deficits    Right Hip Flexion  5/5    Right Hip Extension  4-/5  Right Hip ABduction  4-/5    Left Hip Flexion  5/5    Left Hip Extension  4-/5    Left Hip ABduction  4-/5    Right Knee Flexion  4+/5    Right Knee Extension  4+/5    Left Knee Flexion  5/5    Left Knee Extension  5/5    Right Ankle Dorsiflexion  4+/5    Left Ankle Dorsiflexion  5/5      Palpation   Palpation comment  tenderness over right lumbosacral and QL and  buttock             Objective measurements completed on examination: See above findings.              PT Education - 07/12/17 1628    Education provided  Yes    Education Details  POC, Explanation of findings  intial HEP with clamshell and sitting hamstring stretch    Person(s) Educated  Patient    Methods  Explanation;Demonstration;Verbal cues;Handout;Tactile cues    Comprehension  Verbalized understanding;Returned demonstration       PT Short Term Goals - 07/12/17 1634      PT SHORT TERM GOAL #1   Title  STG=LTG        PT Long Term Goals - 07/12/17 1637      PT LONG TERM GOAL #1   Title  "Demonstrate and verbalize techniques to reduce the risk of re-injury including: lifting, posture, body mechanics.     Time  6    Period  Weeks    Status  New    Target Date  08/23/17      PT LONG TERM GOAL #2   Title  Pt will be independent with advance HEP     Time  6    Period  Weeks    Status  New      PT LONG TERM GOAL #3   Title  "Pt will tolerate sitting for 1 hour without increased pain to ride in car with pain level =/< 3/10    Time  6    Period  Weeks    Status  New    Target Date  08/23/17      PT LONG TERM GOAL #4   Title  Pt will be able to return to aquatics/ exercise 5 x a week without exacerbating pain > than 3/10    Time  6    Period  Weeks    Status  New    Target Date  08/23/17      PT LONG TERM GOAL #5   Title  "FOTO will improve from 55% liimitation   to 46% limitation    indicating improved functional mobility .     Time  6    Period  Weeks    Status  New    Target Date  08/23/17      PT LONG TERM GOAL #6   Title  Pt will be able to execute pain management strategies in order to centralize radiating pain above right knee in order to work full time at current job    Time  6    Period  Weeks    Status  New    Target Date  08/23/17      PT LONG TERM GOAL #7   Title  Pt will improve MMT of LE's to all 4/5 in order to show  improvements in mobility and core strength.    Time  6    Period  Weeks    Status  New    Target Date  08/23/17             Plan - 07/12/17 1656    Clinical Impression Statement  56 yo with 45 year Harrington rod placement  T-5 to L-2 and convex right thoracic scoliosis returned presents in clinic with right side chronic back pain and right sided sciatica.  Pt also has bil carpla tunnel and is scheduled for evalaution by Dr. Ophelia Charter and referred by Dr. Merlyn Lot for cervicalgia.  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 Pt is unable to stand and walk greater than 30 minutes or sit for longer than 45 minutes.   Pt presents with impairments and right radiating pain down to feet and impairments  in AROM , strength, abnormal posture,  pain which will benefit from being addressed with skilled PT for 2 x a week for 6 weeks.     History and Personal Factors relevant to plan of care:  carpal tunnel, Scoliosis, DDD  Scoliosis with Harrington rod place T5 to L2, epilepsy,  See Medical hx     Clinical Presentation  Evolving    Clinical Decision Making  Moderate    Rehab Potential  Good    PT Frequency  2x / week    PT Duration  6 weeks    PT Treatment/Interventions  Cryotherapy;Electrical Stimulation;Moist Heat;Ultrasound;Neuromuscular re-education;Functional mobility training;Patient/family education;Manual techniques;Passive range of motion;Dry needling;Taping    PT Next Visit Plan  check leg length and pelvic level in sitting,   Decompression exercises for lumbar spine     PT Home Exercise Plan  clamshell sidelying,  sitting hamstring    Consulted and Agree with Plan of Care  Patient       Patient will benefit from skilled therapeutic intervention in order to improve the following deficits and impairments:  Difficulty walking, Pain, Postural dysfunction, Improper body mechanics, Increased fascial restricitons, Decreased range of motion, Decreased  strength, Decreased activity tolerance  Visit Diagnosis: Chronic right-sided low back pain with right-sided sciatica  Muscle weakness (generalized)  Abnormal posture  Difficulty in walking, not elsewhere classified     Problem List Patient Active Problem List   Diagnosis Date Noted  . Bilateral carpal tunnel syndrome 12/22/2015  . Low back pain radiating to right leg 12/22/2015  . Epilepsy (HCC) 11/30/2015   Garen Lah, PT Certified Exercise Expert for the Aging Adult  07/12/17 5:11 PM Phone: [phone] Fax: [phone]  [facility] [address] [place], [place], [address] Phone: [phone]   Fax:  [phone]  Name: SHAMILA LERCH MRN: [id number] Date of Birth: [birth year]

## 2017-07-17 ENCOUNTER — Ambulatory Visit: Payer: Managed Care, Other (non HMO) | Admitting: Physical Therapy

## 2017-07-17 ENCOUNTER — Encounter: Payer: Self-pay | Admitting: Physical Therapy

## 2017-07-17 DIAGNOSIS — M5441 Lumbago with sciatica, right side: Principal | ICD-10-CM

## 2017-07-17 DIAGNOSIS — R293 Abnormal posture: Secondary | ICD-10-CM

## 2017-07-17 DIAGNOSIS — G8929 Other chronic pain: Secondary | ICD-10-CM

## 2017-07-17 DIAGNOSIS — M6281 Muscle weakness (generalized): Secondary | ICD-10-CM

## 2017-07-17 DIAGNOSIS — R262 Difficulty in walking, not elsewhere classified: Secondary | ICD-10-CM

## 2017-07-17 NOTE — Patient Instructions (Signed)
Decompression Exercise: Arm Support    Lie on back on firm surface, knees bent, feet flat, arms turned up, out to sides, backs of hands down. Support under arms: towel. Time ___ minutes. Surface: floor   Handout given for head press, shoulder press leg lengthener, leg press down 5 x each side for 5 sec, 2 x per da.

## 2017-07-17 NOTE — Therapy (Signed)
[facility] [address] [place], [place], [address] Phone: [phone]   Fax:  [phone]  Physical Therapy Treatment  Patient Details  Name: Pamela [name] MRN: [id number] Date of Birth: [birth year] Referring Provider: Butch PennyMillikan, Megan NP   Encounter Date: 07/17/2017  PT End of Session - 07/17/17 0820    Visit Number  2    Number of Visits  13    Date for PT Re-Evaluation  08/23/17    Authorization Type  Aetna    PT Start Time  0816 16 minutes late     PT Stop Time  0845    PT Time Calculation (min)  29 min       Past Medical History:  Diagnosis Date  . Anxiety    takes Effexor  . Bilateral carpal tunnel syndrome 12/22/2015  . Bronchitis   . Depression   . 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 Family history of cancer    Pt reports family hx of breast, colon, throat and prostate, is watched closely but no diagnosis  . GERD (gastroesophageal reflux disease)    Pt reports chest pain about 5 years ago but GERD was contributing factor.  Marland Kitchen. Heart murmur    Pt denies seeing a cardiologist on a regular basis, heart studies done over 20 years ago.  Marland Kitchen. History of hiatal hernia   . Hx of degenerative disc disease   . Low back pain radiating to right leg 12/22/2015  . Pneumonia   . Scoliosis   . Seizures (HCC)    epilepsy, started at age 56  . Shortness of breath dyspnea   . 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    Past Surgical History:  Procedure Laterality Date  . ABDOMINAL HYSTERECTOMY    . BACK SURGERY     scoliosis  . EYE SURGERY     tear duct surgery  . removed cysts from bilateral ovaries Bilateral 2010  . SHOULDER SURGERY Right    Removed bone spurs    There were no vitals filed for this visit.  Subjective Assessment - 07/17/17 0818    Subjective  Legs, hands and neck hurting today. I feel like I am falling apart. Did exercises at least once a day.     Currently  in Pain?  Yes    Pain Score  6     Pain Location  Back    Pain Orientation  Right;Left;Mid;Lower    Pain Descriptors / Indicators  Throbbing;Shooting    Pain Type  Chronic pain    Pain Radiating Towards  into bilateral legs to calves                      [facility] Treatment/Exercise - 07/17/17 0001      Exercises   Exercises  Lumbar      Lumbar Exercises: Stretches   Active Hamstring Stretch  2 reps;20 seconds    Active Hamstring Stretch Limitations  seated , also seated nerve glides with DF       Lumbar Exercises: Aerobic   Stationary Bike  Nustep L4 x 5 minutes       Lumbar Exercises: Supine   Other Supine Lumbar Exercises  Decompression series       Lumbar Exercises: Sidelying   Clam  15 reps        Seated scap squeeze x 10  PT Education - 07/17/17 0912    Education provided  Yes    Education Details  HEP    Person(s) Educated  Patient    Methods  Handout;Explanation    Comprehension  Verbalized understanding       PT Short Term Goals - 07/12/17 1634      PT SHORT TERM GOAL #1   Title  STG=LTG        PT Long Term Goals - 07/12/17 1637      PT LONG TERM GOAL #1   Title  "Demonstrate and verbalize techniques to reduce the risk of re-injury including: lifting, posture, body mechanics.     Time  6    Period  Weeks    Status  New    Target Date  08/23/17      PT LONG TERM GOAL #2   Title  Pt will be independent with advance HEP     Time  6    Period  Weeks    Status  New      PT LONG TERM GOAL #3   Title  "Pt will tolerate sitting for 1 hour without increased pain to ride in car with pain level =/< 3/10    Time  6    Period  Weeks    Status  New    Target Date  08/23/17      PT LONG TERM GOAL #4   Title  Pt will be able to return to aquatics/ exercise 5 x a week without exacerbating pain > than 3/10    Time  6    Period  Weeks    Status  New    Target Date  08/23/17      PT LONG TERM GOAL #5   Title  "FOTO  will improve from 55% liimitation   to 46% limitation    indicating improved functional mobility .     Time  6    Period  Weeks    Status  New    Target Date  08/23/17      PT LONG TERM GOAL #6   Title  Pt will be able to execute pain management strategies in order to centralize radiating pain above right knee in order to work full time at current job    Time  6    Period  Weeks    Status  New    Target Date  08/23/17      PT LONG TERM GOAL #7   Title  Pt will improve MMT of LE's to all 4/5 in order to show improvements in mobility and core strength.    Time  6    Period  Weeks    Status  New    Target Date  08/23/17            Plan - 07/17/17 1610    Clinical Impression Statement  Pt arrived late today. She reports HEP compliance at least once a day. We reviewed her HEP and  added decompression series. Instructed pt in seated LE nervve glides. Pt felt Leg lengthener exercises was helpful on right side.     PT Next Visit Plan  check leg length and pelvic level in sitting,  Review  Decompression exercises for lumbar spine     PT Home Exercise Plan  clamshell sidelying,  sitting hamstring, decompression series     Consulted and Agree with Plan of Care  Patient       Patient will benefit  from skilled therapeutic intervention in order to improve the following deficits and impairments:  Difficulty walking, Pain, Postural dysfunction, Improper body mechanics, Increased fascial restricitons, Decreased range of motion, Decreased strength, Decreased activity tolerance  Visit Diagnosis: Chronic right-sided low back pain with right-sided sciatica  Muscle weakness (generalized)  Abnormal posture  Difficulty in walking, not elsewhere classified     Problem List Patient Active Problem List   Diagnosis Date Noted  . Bilateral carpal tunnel syndrome 12/22/2015  . Low back pain radiating to right leg 12/22/2015  . Epilepsy (HCC) 11/30/2015    Sherrie Mustacheonoho, Jessica McGee,  PTA 07/17/2017, 9:13 AM  [facility] [address] [place], [place], [address] Phone: [phone]   Fax:  [phone]  Name: Pamela AmorJacqueline D [name] MRN: [id number] Date of Birth: Aug 01, 1961

## 2017-07-19 ENCOUNTER — Ambulatory Visit: Payer: Managed Care, Other (non HMO) | Admitting: Physical Therapy

## 2017-07-19 ENCOUNTER — Encounter: Payer: Self-pay | Admitting: Physical Therapy

## 2017-07-19 DIAGNOSIS — M6281 Muscle weakness (generalized): Secondary | ICD-10-CM

## 2017-07-19 DIAGNOSIS — R293 Abnormal posture: Secondary | ICD-10-CM

## 2017-07-19 DIAGNOSIS — R262 Difficulty in walking, not elsewhere classified: Secondary | ICD-10-CM

## 2017-07-19 DIAGNOSIS — G8929 Other chronic pain: Secondary | ICD-10-CM

## 2017-07-19 DIAGNOSIS — M5441 Lumbago with sciatica, right side: Principal | ICD-10-CM

## 2017-07-19 NOTE — Patient Instructions (Addendum)
Supine Knee-to-Chest, Unilateral    Lie on back, hands clasped behind one knee. Pull knee in toward chest until a comfortable stretch is felt in lower back and buttocks. . Hold _10_ seconds.  Repeat 5___ times per session. Do __1-2_ sessions per day.  Same shoulder side    These two piriformis stretches can be done at work and at home.  Do either stretch that works  Hip Stretch  Put right ankle over left knee. Let right knee fall downward, but keep ankle in place. Feel the stretch in hip. May push down gently with hand to feel stretch. Hold _30___ seconds while counting out loud. Repeat with other leg. Repeat 2-3____ times. Do _2-3___ sessions per day.   Stretching: Piriformis (Supine)  Pull right knee toward opposite shoulder. Hold __30__ seconds. Relax. Repeat _3___ times per set. Do _1___ sets per session. Do __2__ sessions per day.  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

## 2017-07-19 NOTE — Therapy (Signed)
[facility] [address] [place], [place], [address] Phone: [phone]   Fax:  [phone]  Physical Therapy Treatment  Patient Details  Name: MANILLA STRIETER MRN: [id number] Date of Birth: 1961-08-28 Referring Provider: Butch Penny NP   Encounter Date: 07/19/2017  PT End of Session - 07/19/17 0948    Visit Number  3    Number of Visits  13    Date for PT Re-Evaluation  08/23/17    Authorization Type  Aetna    PT Start Time  0800    PT Stop Time  0900    PT Time Calculation (min)  60 min    Activity Tolerance  Patient tolerated treatment well    Behavior During Therapy  [facility] for tasks assessed/performed       Past Medical History:  Diagnosis Date  . Anxiety    takes Effexor  . Bilateral carpal tunnel syndrome 12/22/2015  . Bronchitis   . Depression   . 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 Family history of cancer    Pt reports family hx of breast, colon, throat and prostate, is watched closely but no diagnosis  . GERD (gastroesophageal reflux disease)    Pt reports chest pain about 5 years ago but GERD was contributing factor.  Marland Kitchen Heart murmur    Pt denies seeing a cardiologist on a regular basis, heart studies done over 20 years ago.  Marland Kitchen History of hiatal hernia   . Hx of degenerative disc disease   . Low back pain radiating to right leg 12/22/2015  . Pneumonia   . Scoliosis   . Seizures (HCC)    epilepsy, started at age 29  . Shortness of breath dyspnea   . 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    Past Surgical History:  Procedure Laterality Date  . ABDOMINAL HYSTERECTOMY    . BACK SURGERY     scoliosis  . EYE SURGERY     tear duct surgery  . LUMBAR LAMINECTOMY/DECOMPRESSION MICRODISCECTOMY N/A 07/29/2014   Procedure: LUMBAR LAMINECTOMY/DECOMPRESSION MICRODISCECTOMY;  Surgeon: Emilee Hero, MD;  Location: [facility];  Service: Orthopedics;  Laterality: N/A;   Lumbar 4-5 decompression  . removed cysts from bilateral ovaries Bilateral 2010  . SHOULDER SURGERY Right    Removed bone spurs    There were no vitals filed for this visit.  Subjective Assessment - 07/19/17 0806    Subjective  Dr. Merlyn Lot put me on Celebrex and I think I am having an allergic reaction. I feel like some one that is squeezing my veins.  I did not take it this morning.     Pertinent History  carpal tunnel, Scoliosis, DDD  Scoliosis with Harrington rod place T5 to L2, epilepsy,  See Medical hx     Limitations  Sitting;Standing;Walking;Lifting;House hold activities    Diagnostic tests  x rays one year ago.  current ones ordered    Patient Stated Goals  relief from pain, pain management  strategies    Currently in Pain?  Yes    Pain Score  7     Pain Location  Back    Pain Orientation  Right;Left;Mid;Lower    Pain Descriptors / Indicators  Throbbing;Shooting    Pain Type  Chronic pain    Pain Onset  More than a month ago    Pain Frequency  Constant    Multiple Pain Sites  Yes    Pain Score  6    Pain Location  Head no medicine     Pain Orientation  Right;Left bilateral temples    Pain Descriptors / Indicators  Throbbing    Pain Onset  In the past 7 days         [facility] - 07/19/17 0808      Observation/Other Assessments   Observations  Leg lengthen apparent on right slightly shorter than left  36inches bil                  [facility] Adult PT Treatment/Exercise - 07/19/17 0839      Self-Care   Self-Care  Other Self-Care Comments    Other Self-Care Comments   education on heel lift in right shoe      Lumbar Exercises: Stretches   Single Knee to Chest Stretch  5 reps;10 seconds vc and tc with towel to assist    Piriformis Stretch  4 reps right and left sitting and supine 2 x each andtotal 8      Lumbar Exercises: Standing   Other Standing Lumbar Exercises  weight shift with heel lift side to side and one foot forward  for 3 minutes and to use  a work      Lumbar Exercises: Supine   Bridge  5 reps PT must assist due to glut weakness    Other Supine Lumbar Exercises  Decompression series       Lumbar Exercises: Sidelying   Clam  10 reps right only today to tolerance      Modalities   Modalities  Moist Heat      Moist Heat Therapy   Number Minutes Moist Heat  15 Minutes    Moist Heat Location  Lumbar Spine             PT Education - 07/19/17 0847    Education provided  Yes    Education Details  given piriformis stretches and SKTC for back stretching. and heel lift on right for apparent leg length discrepancey. scoliosis, elevated right pelvis     Person(s) Educated  Patient    Methods  Explanation;Demonstration;Tactile cues;Verbal cues;Handout    Comprehension  Verbalized understanding;Returned demonstration;Need further instruction       PT Short Term Goals - 07/19/17 0952      PT SHORT TERM GOAL #1   Title  STG=LTG        PT Long Term Goals - 07/19/17 0952      PT LONG TERM GOAL #1   Title  "Demonstrate and verbalize techniques to reduce the risk of re-injury including: lifting, posture, body mechanics.     Baseline  inital education    Time  6    Period  Weeks    Status  On-going      PT LONG TERM GOAL #2   Title  Pt will be independent with advance HEP     Baseline  Given initial education    Time  6    Period  Weeks    Status  On-going      PT LONG TERM GOAL #3   Title  "Pt will tolerate sitting for 1 hour without increased pain to ride in car with pain level =/< 3/10    Baseline  Pt with pain level 7/10 today and headache    Time  6    Period  Weeks    Status  On-going      PT LONG TERM GOAL #4   Title  Pt will be able to return to aquatics/ exercise 5 x a week without exacerbating pain > than 3/10    Baseline  Pt with overall body pain and 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     Time  6    Period  Weeks    Status  Unable to assess      PT LONG TERM GOAL #6   Title  Pt will be able to execute pain management strategies in order to centralize radiating pain above right knee in order to work full time at current job    Baseline  given heel lift for leg length discrepancy ( apparent) today in right heel    Time  6    Period  Weeks    Status  On-going      PT LONG TERM GOAL #7   Title  Pt will improve MMT of LE's to all 4/5 in order to show improvements in mobility and core strength.    Baseline  Pt only able to do stretches and decompression for pain control right now    Time  6    Period  Weeks    Status  On-going            Plan - 07/19/17 0816    Clinical Impression Statement  Pt arrived tearful and with a headache. Pt was mad at herself for taking Celebrex which she says she has an allergic reaction to with squeezing/throbbing pain in legs and she has a headache this morning.  Pt stated she had not eaten and was given water and crackers during clinic time.  Pt was measured for an apparent leg length discrepancy . 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  Problem solved use of exercise during the day due to patient being tired at end of day..  Pt will need a moist microwavable hot pack for home use.  Pt " hurts all over".and was educated on importance of movement and exercise for health and wellness and to lubricate joints       Patient will benefit from skilled therapeutic intervention in order to improve the following deficits and impairments:  Difficulty walking, Pain, Postural dysfunction, Improper body mechanics, Increased fascial restricitons, Decreased range of motion, Decreased strength, Decreased activity tolerance  Visit  Diagnosis: Chronic right-sided low back pain with right-sided sciatica  Muscle weakness (generalized)  Abnormal posture  Difficulty in walking, not elsewhere classified     Problem List Patient Active Problem List   Diagnosis Date Noted  . Bilateral carpal tunnel syndrome 12/22/2015  . Low back pain radiating to right leg 12/22/2015  . Epilepsy (HCC) 11/30/2015   Garen LahLawrie Ardell Makarewicz, PT Certified Exercise Expert for the Aging Adult  07/19/17 9:57 AM Phone: [phone] Fax: [phone]  [facility] [address] [place], [place], [address] Phone: [phone]   Fax:  [phone]  Name: Charlton AmorJacqueline D Vasbinder MRN: [id number] Date of Birth: [birth year]

## 2017-07-24 ENCOUNTER — Ambulatory Visit: Payer: Managed Care, Other (non HMO) | Admitting: Physical Therapy

## 2017-07-24 ENCOUNTER — Encounter: Payer: Self-pay | Admitting: Physical Therapy

## 2017-07-24 DIAGNOSIS — R262 Difficulty in walking, not elsewhere classified: Secondary | ICD-10-CM

## 2017-07-24 DIAGNOSIS — R293 Abnormal posture: Secondary | ICD-10-CM

## 2017-07-24 DIAGNOSIS — M6281 Muscle weakness (generalized): Secondary | ICD-10-CM

## 2017-07-24 DIAGNOSIS — G8929 Other chronic pain: Secondary | ICD-10-CM

## 2017-07-24 DIAGNOSIS — M5441 Lumbago with sciatica, right side: Principal | ICD-10-CM

## 2017-07-24 NOTE — Patient Instructions (Signed)

## 2017-07-24 NOTE — Therapy (Signed)
[facility] [address] [place], [place], [address] Phone: [phone]   Fax:  [phone]  Physical Therapy Treatment  Patient Details  Name: Pamela [name] MRN: [id number] Date of Birth: 10-18-1960 Referring Provider: Butch PennyMillikan, Megan NP   Encounter Date: 07/24/2017  PT End of Session - 07/24/17 0838    Visit Number  4    Number of Visits  13    Date for PT Re-Evaluation  08/23/17    PT Start Time  0801    PT Stop Time  0900    PT Time Calculation (min)  59 min    Activity Tolerance  Patient tolerated treatment well    Behavior During Therapy  [facility] for tasks assessed/performed       Past Medical History:  Diagnosis Date  . Anxiety    takes Effexor  . Bilateral carpal tunnel syndrome 12/22/2015  . Bronchitis   . Depression   . 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 Family history of cancer    Pt reports family hx of breast, colon, throat and prostate, is watched closely but no diagnosis  . GERD (gastroesophageal reflux disease)    Pt reports chest pain about 5 years ago but GERD was contributing factor.  Marland Kitchen. Heart murmur    Pt denies seeing a cardiologist on a regular basis, heart studies done over 20 years ago.  Marland Kitchen. History of hiatal hernia   . Hx of degenerative disc disease   . Low back pain radiating to right leg 12/22/2015  . Pneumonia   . Scoliosis   . Seizures (HCC)    epilepsy, started at age 56  . Shortness of breath dyspnea   . 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    Past Surgical History:  Procedure Laterality Date  . ABDOMINAL HYSTERECTOMY    . BACK SURGERY     scoliosis  . EYE SURGERY     tear duct surgery  . LUMBAR LAMINECTOMY/DECOMPRESSION MICRODISCECTOMY N/A 07/29/2014   Performed by Emilee Heroumonski, Mark Leonard[facility]  . removed cysts from bilateral ovaries Bilateral 2010  . SHOULDER SURGERY Right    Removed bone spurs    There were no vitals  filed for this visit.  Subjective Assessment - 07/24/17 0803    Subjective  Stretches help.  More flexible.  She placed heel lift all layers in shoe and it is helping.    Currently in Pain?  Yes    Pain Score  -- up to 6-8/10    Pain Location  Back    Pain Descriptors / Indicators  Throbbing;Shooting    Pain Type  Chronic pain    Pain Radiating Towards  into legs,  calves    Pain Frequency  Constant    Aggravating Factors   moving    Pain Relieving Factors  heating,  stretching    Effect of Pain on Daily Activities  wakes,  It should but it doesn't limit ADL.    Pain Score  5 9/10 by the end of the day    Pain Location  Neck    Pain Orientation  Right;Left    Pain Descriptors / Indicators  Pressure on the top og shoulders,  feel it swelling    Pain Type  Chronic pain    Pain Frequency  Constant    Aggravating Factors   work,  moving    Pain Relieving Factors  heat  Effect of Pain on Daily Activities  can wake at night                      [facility] Adult PT Treatment/Exercise - 07/24/17 0001      Lumbar Exercises: Stretches   Passive Hamstring Stretch  3 reps;30 seconds    Single Knee to Chest Stretch  5 reps    Piriformis Stretch  4 reps right and left sitting and supine 2 x each andtotal 8      Lumbar Exercises: Supine   Bridge  10 reps cued,  breathing,     Other Supine Lumbar Exercises  Decompression  series,  extra time for ed,  4 pillows used to decrease pain cued to move shoulders and keep low back from arching.      Modalities   Modalities  Moist Heat      Moist Heat Therapy   Number Minutes Moist Heat  15 Minutes    Moist Heat Location  Lumbar Spine;Cervical             PT Education - 07/24/17 0836    Education provided  Yes    Education Details  Current HEP,  Posture ADL ed    Person(s) Educated  Patient    Methods  Explanation;Tactile cues;Demonstration;Verbal cues;Handout    Comprehension  Verbalized understanding;Returned demonstration        PT Short Term Goals - 07/19/17 0952      PT SHORT TERM GOAL #1   Title  STG=LTG        PT Long Term Goals - 07/19/17 0952      PT LONG TERM GOAL #1   Title  "Demonstrate and verbalize techniques to reduce the risk of re-injury including: lifting, posture, body mechanics.     Baseline  inital education    Time  6    Period  Weeks    Status  On-going      PT LONG TERM GOAL #2   Title  Pt will be independent with advance HEP     Baseline  Given initial education    Time  6    Period  Weeks    Status  On-going      PT LONG TERM GOAL #3   Title  "Pt will tolerate sitting for 1 hour without increased pain to ride in car with pain level =/< 3/10    Baseline  Pt with pain level 7/10 today and headache    Time  6    Period  Weeks    Status  On-going      PT LONG TERM GOAL #4   Title  Pt will be able to return to aquatics/ exercise 5 x a week without exacerbating pain > than 3/10    Baseline  Pt with overall body pain and 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     Time  6    Period  Weeks    Status  Unable to assess      PT LONG TERM GOAL #6   Title  Pt will be able to execute pain management strategies in order  to centralize radiating pain above right knee in order to work full time at current job    Baseline  given heel lift for leg length discrepancy ( apparent) today in right heel    Time  6    Period  Weeks    Status  On-going      PT LONG TERM GOAL #7   Title  Pt will improve MMT of LE's to all 4/5 in order to show improvements in mobility and core strength.    Baseline  Pt only able to do stretches and decompression for pain control right now    Time  6    Period  Weeks    Status  On-going            Plan - 07/24/17  0843    Clinical Impression Statement  Patient has been doing stretches and they are helping.  Moderate cues needed for Decompression.  4 pillows needed to avoid pain.  patient felt taller post session.  No pain increased at end of session..  PT Garen LahLawrie Beardsley gave patient a microwave moist heat/cold pack for pain management.     PT Next Visit Plan  check leg length and pelvic level in sitting,  Review  Decompression exercises for lumbar spine     PT Home Exercise Plan  clamshell sidelying,  sitting hamstring, decompression series     Consulted and Agree with Plan of Care  Patient       Patient will benefit from skilled therapeutic intervention in order to improve the following deficits and impairments:     Visit Diagnosis: Chronic right-sided low back pain with right-sided sciatica  Muscle weakness (generalized)  Abnormal posture  Difficulty in walking, not elsewhere classified     Problem List Patient Active Problem List   Diagnosis Date Noted  . Bilateral carpal tunnel syndrome 12/22/2015  . Low back pain radiating to right leg 12/22/2015  . Epilepsy (HCC) 11/30/2015    Jolane Bankhead PTA 07/24/2017, 8:47 AM  [facility] [address] [place], [place], [address] Phone: [phone]   Fax:  [phone]  Name: Pamela [name] MRN: [id number] Date of Birth: October 30, 1960

## 2017-07-25 NOTE — Telephone Encounter (Signed)
Continued full schedule. Will call patient if open space becomes available.

## 2017-07-31 ENCOUNTER — Ambulatory Visit: Payer: Managed Care, Other (non HMO) | Admitting: Physical Therapy

## 2017-08-02 ENCOUNTER — Encounter: Payer: Self-pay | Admitting: Physical Therapy

## 2017-08-02 ENCOUNTER — Ambulatory Visit: Payer: Managed Care, Other (non HMO) | Admitting: Physical Therapy

## 2017-08-02 DIAGNOSIS — R262 Difficulty in walking, not elsewhere classified: Secondary | ICD-10-CM

## 2017-08-02 DIAGNOSIS — G8929 Other chronic pain: Secondary | ICD-10-CM

## 2017-08-02 DIAGNOSIS — R293 Abnormal posture: Secondary | ICD-10-CM

## 2017-08-02 DIAGNOSIS — M5441 Lumbago with sciatica, right side: Principal | ICD-10-CM

## 2017-08-02 DIAGNOSIS — M6281 Muscle weakness (generalized): Secondary | ICD-10-CM

## 2017-08-02 NOTE — Therapy (Signed)
[facility] [address] [place], [place], [address] Phone: [phone]   Fax:  [phone]  Physical Therapy Treatment  Patient Details  Name: Pamela [name] MRN: [id number] Date of Birth: [birth year] Referring Provider: Butch Penny NP   Encounter Date: 08/02/2017  PT End of Session - 08/02/17 0828    Visit Number  5    Number of Visits  13    Date for PT Re-Evaluation  08/23/17    Authorization Type  [employer]    PT Start Time  0815 arrived 15 minutes late, said she got lost    PT Stop Time  0900    PT Time Calculation (min)  45 min    Activity Tolerance  Patient tolerated treatment well    Behavior During Therapy  [facility] for tasks assessed/performed       Past Medical History:  Diagnosis Date  . Anxiety    takes Effexor  . Bilateral carpal tunnel syndrome 12/22/2015  . Bronchitis   . Depression   . 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 Family history of cancer    Pt reports family hx of breast, colon, throat and prostate, is watched closely but no diagnosis  . GERD (gastroesophageal reflux disease)    Pt reports chest pain about 5 years ago but GERD was contributing factor.  Marland Kitchen Heart murmur    Pt denies seeing a cardiologist on a regular basis, heart studies done over 20 years ago.  Marland Kitchen History of hiatal hernia   . Hx of degenerative disc disease   . Low back pain radiating to right leg 12/22/2015  . Pneumonia   . Scoliosis   . Seizures (HCC)    epilepsy, started at age 58  . Shortness of breath dyspnea   . 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    Past Surgical History:  Procedure Laterality Date  . ABDOMINAL HYSTERECTOMY    . BACK SURGERY     scoliosis  . EYE SURGERY     tear duct surgery  . LUMBAR LAMINECTOMY/DECOMPRESSION MICRODISCECTOMY N/A 07/29/2014   Procedure: LUMBAR LAMINECTOMY/DECOMPRESSION MICRODISCECTOMY;  Surgeon: Emilee Hero, MD;  Location: [facility]  OR;  Service: Orthopedics;  Laterality: N/A;  Lumbar 4-5 decompression  . removed cysts from bilateral ovaries Bilateral 2010  . SHOULDER SURGERY Right    Removed bone spurs    There were no vitals filed for this visit.  Subjective Assessment - 08/02/17 0818    Subjective  I am doing better with my exercise and I use my heat pad. I am having trouble sleeping  in your back and neck. I am seeing Dr. Ophelia Charter on December 4th.    Pertinent History  carpal tunnel, Scoliosis, DDD  Scoliosis with Harrington rod place T5 to L2, epilepsy,  See Medical hx     How long can you sit comfortably?  30 to 45 minutes    How long can you stand comfortably?  30 minutes was on vacation this week    How long can you walk comfortably?  30 minutes    Diagnostic tests  x rays one year ago.  current ones ordered    Patient Stated Goals  relief from pain, pain management  strategies    Currently in Pain?  Yes    Pain Score  3  3/10 in the AM but it gets up to 8-9/10    Pain Location  Back    Pain  Orientation  Right;Left;Lower    Pain Type  Chronic pain    Pain Onset  More than a month ago    Pain Frequency  Constant                      [facility] Adult PT Treatment/Exercise - 08/02/17 0820      Self-Care   Self-Care  Other Self-Care Comments    Other Self-Care Comments   reviewed posture/body mechanics briefley  Pt able to verbalize 3 pain management strateigies given towel roll under left ischial tuberosity to level pelv      Lumbar Exercises: Stretches   Quadruped Mid Back Stretch  3 reps;30 seconds to left side childs pose stretch 3 x 30 sec  pain relief      Lumbar Exercises: Supine   Ab Set  10 reps    Other Supine Lumbar Exercises  pelvic tilt x 10 x 2    Other Supine Lumbar Exercises  briefly reviewed decompression      Lumbar Exercises: Prone   Opposite Arm/Leg Raise  10 reps x 2  VC for abdominal control and neck postion      Modalities   Modalities  Moist Heat      Moist Heat  Therapy   Number Minutes Moist Heat  15 Minutes    Moist Heat Location  Lumbar Spine             PT Education - 08/02/17 0956    Education provided  Yes    Education Details  added to HEP  , reviewed posture/body mechanics. given suggestion for sitting with left hip elevated with towel roll for even pelvic level and comfort    Person(s) Educated  Patient    Methods  Explanation;Demonstration;Verbal cues;Tactile cues;Handout    Comprehension  Verbalized understanding;Returned demonstration       PT Short Term Goals - 07/19/17 0952      PT SHORT TERM GOAL #1   Title  STG=LTG        PT Long Term Goals - 08/02/17 0824      PT LONG TERM GOAL #1   Title  "Demonstrate and verbalize techniques to reduce the risk of re-injury including: lifting, posture, body mechanics.     Baseline  Pt able to verbalize 3 strategies for pain management in order to do ADL's and body mechanics    Time  6    Period  Weeks    Status  Achieved      PT LONG TERM GOAL #2   Title  Pt will be independent with advance HEP     Baseline  Pt working on advanced exercises    Time  6    Period  Weeks    Status  On-going      PT LONG TERM GOAL #3   Title  "Pt will tolerate sitting for 1 hour without increased pain to ride in car with pain level =/< 3/10    Baseline  Pt at 3/10 this am and can sit for 3- to 45 minutes    Time  6    Period  Weeks    Status  On-going      PT LONG TERM GOAL #4   Title  Pt will be able to return to aquatics/ exercise 5 x a week without exacerbating pain > than 3/10    Baseline  Pt just joined a gym for aquatics this weeek.    Time  6  Period  Weeks    Status  On-going      PT LONG TERM GOAL #5   Title  "FOTO will improve from 55% liimitation   to 46% limitation    indicating improved functional mobility .     Baseline  FOTO taken today reached predicted 46% limitaion    Time  6    Period  Weeks    Status  Achieved      PT LONG TERM GOAL #6   Title  Pt will be  able to execute pain management strategies in order to centralize radiating pain above right knee in order to work full time at current job    Baseline  Heel lift effective  Pt can now stand for 3 hours now     Time  6    Period  Weeks    Status  On-going      PT LONG TERM GOAL #7   Title  Pt will improve MMT of LE's to all 4/5 in order to show improvements in mobility and core strength.    Baseline  Pt only able to do stretches and decompression for pain control right now, MMT not taken today.    Time  6    Period  Weeks    Status  On-going            Plan - 08/02/17 0957    Clinical Impression Statement  Pt arrived 15 minutes late and had time for 30 minute RX Pt doing very well with decompression exercises.  Pt given intial core strengthening.  Pt able to complete longterm goal for posture body mechanics and FOTO score improved to 46% limitation with achievement of LTG.  Pt states the heel lift in shoe helps her at her job be able to stand for  3 hours more comfortably but she still has 8/10 pain at end of day.  she plans on seeing Dr. Annell GreeningMark Yates on December 4th for evaluation.  will continue as needed after Dr. Ophelia CharterYates evaluates.      Rehab Potential  Good    PT Frequency  2x / week    PT Duration  6 weeks    PT Treatment/Interventions  Cryotherapy;Electrical Stimulation;Moist Heat;Ultrasound;Neuromuscular re-education;Functional mobility training;Patient/family education;Manual techniques;Passive range of motion;Dry needling;Taping    PT Next Visit Plan   Review  Decompression exercises for lumbar spine and intial core  See what Dr. Ophelia CharterYates about her MD evaluation    PT Home Exercise Plan  clamshell sidelying,  sitting hamstring, decompression series , prone quadriped bird dog, childs pose , pelvic tilt , ab set    Consulted and Agree with Plan of Care  Patient       Patient will benefit from skilled therapeutic intervention in order to improve the following deficits and impairments:   Difficulty walking, Pain, Postural dysfunction, Improper body mechanics, Increased fascial restricitons, Decreased range of motion, Decreased strength, Decreased activity tolerance  Visit Diagnosis: Chronic right-sided low back pain with right-sided sciatica  Muscle weakness (generalized)  Abnormal posture  Difficulty in walking, not elsewhere classified     Problem List Patient Active Problem List   Diagnosis Date Noted  . Bilateral carpal tunnel syndrome 12/22/2015  . Low back pain radiating to right leg 12/22/2015  . Epilepsy (HCC) 11/30/2015    Garen LahLawrie Savanna Dooley, PT Certified Exercise Expert for the Aging Adult  08/02/17 10:11 AM Phone: [phone] Fax: [phone]  [facility] [address] [place], [place],  [id number] Phone: [phone]   Fax:  [phone]  Name: Charlton AmorJacqueline D Luedke MRN: [id number] Date of Birth: 08/21/1961

## 2017-08-02 NOTE — Patient Instructions (Addendum)
   PELVIC TILT  Lie on back, legs bent. Exhale, tilting top of pelvis back, pubic bone up, to flatten lower back. Inhale, rolling pelvis opposite way, top forward, pubic bone down, arch in back. Repeat __10__ times. Do __2__ sessions per day. Copyright  VHI. All rights reserved.    Isometric Hold With Pelvic Floor (Hook-Lying)  Lie with hips and knees bent. Slowly inhale, and then exhale. Pull navel toward spine and tighten pelvic floor. Hold for __10_ seconds. Continue to breathe in and out during hold. Rest for _10__ seconds. Repeat __10_ times. Do __2-3_ times a day.                 Garen LahLawrie Marilee Ditommaso, PT Certified Exercise Expert for the Aging Adult  08/02/17 8:37 AM Phone: [phone] Fax: [phone]

## 2017-08-07 ENCOUNTER — Ambulatory Visit (INDEPENDENT_AMBULATORY_CARE_PROVIDER_SITE_OTHER): Payer: Managed Care, Other (non HMO)

## 2017-08-07 ENCOUNTER — Encounter (INDEPENDENT_AMBULATORY_CARE_PROVIDER_SITE_OTHER): Payer: Self-pay | Admitting: Orthopaedic Surgery

## 2017-08-07 ENCOUNTER — Telehealth (INDEPENDENT_AMBULATORY_CARE_PROVIDER_SITE_OTHER): Payer: Self-pay | Admitting: Orthopaedic Surgery

## 2017-08-07 ENCOUNTER — Ambulatory Visit (INDEPENDENT_AMBULATORY_CARE_PROVIDER_SITE_OTHER): Payer: Managed Care, Other (non HMO) | Admitting: Orthopaedic Surgery

## 2017-08-07 VITALS — BP 100/60 | HR 63 | Ht 67.0 in | Wt 148.0 lb

## 2017-08-07 DIAGNOSIS — G8929 Other chronic pain: Secondary | ICD-10-CM

## 2017-08-07 DIAGNOSIS — M545 Low back pain: Secondary | ICD-10-CM

## 2017-08-07 DIAGNOSIS — M542 Cervicalgia: Secondary | ICD-10-CM

## 2017-08-07 NOTE — Telephone Encounter (Signed)
Patient wanted a copy of her notes from todays visit mailed to her home address if possible. CB # [phone]

## 2017-08-07 NOTE — Progress Notes (Signed)
Consult Note   Patient: Pamela [name]             Date of Birth: 1960/11/25           MRN: [id number]             Visit Date: 08/07/2017  Requested by: Cindee SaltKuzma, Gary, MD [address] [place], [place] [id number] PCP: Clayborn Heronankins, Victoria R, MD  Thank you for asking me to evaluate this patient for Pain of the Neck and Pain of the Lower Back  Below are my findings.     Assessment & Plan: Visit Diagnoses:  1. Neck pain   2. Chronic bilateral low back pain, with sciatica presence unspecified     Plan: We will proceed with cervical MRI scan to evaluate her for spondylosis and foraminal stenosis consistent with her hand numbness.  She is already had carpal tunnel injections and is wearing splints and has numbness and weakness in all fingers.  She has no weakness biceps triceps and has intact upper extremity reflexes.  Office follow-up after MRI for review.  We discussed her lumbar problems with only 3 remaining mobile units and we will defer lumbar evaluation until later point.  I will see her after cervical MRI scan.  Thank you for the opportunity to see her in consultation.  Follow Up Instructions: Follow-up after cervical MRI scan.  Orders: Orders Placed This Encounter  Procedures  . XR Cervical Spine 2 or 3 views  . XR Lumbar Spine 2-3 Views   No orders of the defined types were placed in this encounter.    Procedures: No procedures performed   Clinical Data: No additional findings.   Subjective:  Chief Complaint  Patient presents with  . Neck - Pain  . Lower Back - Pain     HPI 56 year old female here for consultation at the request of Dr. Cindee SaltGary Kuzma for problems with low back pain and neck pain.  She has bilateral carpal tunnel syndrome is wearing braces has had an injection and possible surgery has been discussed with her.  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  She had previous scoliosis surgery at age 56 with Harrington rod  fixation down to L2 level.  She has some degenerative changes on plain radiograph at L3-4 and has both neck pain that bothers her with work activity as well as low back pain.  Patient also has epilepsy.  She is currently on meloxicam uses some topical cream, heating pad Tylenol arthritis.  No bowel or bladder problems patient has normal BMI.  Patient wears a back brace and states that seems to help some.   Review of Systems a 14 point review of systems positive for epilepsy, bilateral carpal tunnel syndrome.  Cervical spondylosis, low back pain.  Scoliosis with previous fusion with Harrington rod instrumentation age 56.  Positive for anxiety history of drug dependence clean for 23 yrs  and epilepsy.   Objective: Vital Signs: BP 100/60   Pulse 63   Ht 5\' 7"  (1.702 m)   Wt 148 lb (67.1 kg)   BMI 23.18 kg/m   Physical Exam  Constitutional: She is oriented to person, place, and time. She appears well-developed.  HENT:  Head: Normocephalic.  Right Ear: External ear normal.  Left Ear: External ear normal.  Eyes: Pupils are equal, round, and reactive to light.  Neck: No tracheal deviation present. No thyromegaly present.  Cardiovascular: Normal rate.  Pulmonary/Chest: Effort normal.  Abdominal:  Soft.  Neurological: She is alert and oriented to person, place, and time.  Skin: Skin is warm and dry.  Psychiatric: She has a normal mood and affect. Her behavior is normal.     Ortho Exam 70 reflexes are intact she has some brachial plexus tenderness right and left limitation of range of motion 50 to 70 % tilting and rotating.  He was straight leg raising 90 degrees.  Specialty Comments:  No specialty comments available.  Imaging:  No results found.   PMFS History: Patient Active Problem List   Diagnosis Date Noted  . Bilateral carpal tunnel syndrome 12/22/2015  . Low back pain radiating to right leg 12/22/2015  . Epilepsy (HCC) 11/30/2015   Past Medical History:  Diagnosis Date  .  Anxiety    takes Effexor  . Bilateral carpal tunnel syndrome 12/22/2015  . Bronchitis   . Depression   . 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 Family history of cancer    Pt reports family hx of breast, colon, throat and prostate, is watched closely but no diagnosis  . GERD (gastroesophageal reflux disease)    Pt reports chest pain about 5 years ago but GERD was contributing factor.  Marland Kitchen. Heart murmur    Pt denies seeing a cardiologist on a regular basis, heart studies done over 20 years ago.  Marland Kitchen. History of hiatal hernia   . Hx of degenerative disc disease   . Low back pain radiating to right leg 12/22/2015  . Pneumonia   . Scoliosis   . Seizures (HCC)    epilepsy, started at age 56  . Shortness of breath dyspnea   . 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    Family History  Problem Relation Age of Onset  . Prostate cancer Father   . Diabetes Father   . Breast cancer Mother   . Prostate cancer Brother   . Hepatitis C Brother   . Seizures Neg Hx    Past Surgical History:  Procedure Laterality Date  . ABDOMINAL HYSTERECTOMY    . BACK SURGERY     scoliosis  . EYE SURGERY     tear duct surgery  . LUMBAR LAMINECTOMY/DECOMPRESSION MICRODISCECTOMY N/A 07/29/2014   Procedure: LUMBAR LAMINECTOMY/DECOMPRESSION MICRODISCECTOMY;  Surgeon: Emilee HeroMark Leonard Dumonski, MD;  [facility];  Service: Orthopedics;  Laterality: N/A;  Lumbar 4-5 decompression  . removed cysts from bilateral ovaries Bilateral 2010  . SHOULDER SURGERY Right    Removed bone spurs   Social History   Occupational History  . Not on file  Tobacco Use  . Smoking status: Current Every Day Smoker    Packs/day: 1.00    Types: Cigarettes  . Smokeless tobacco: Never Used  Substance and Sexual Activity  . Alcohol use: No    Comment: history of abuse, sober for 21 years  . 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 Sexual activity: Not  on file

## 2017-08-07 NOTE — Addendum Note (Signed)
Addended by: Rogers SeedsYEATTS, Bertine Schlottman M on: 08/07/2017 09:43 AM   Modules accepted: Orders

## 2017-08-09 ENCOUNTER — Encounter: Payer: Self-pay | Admitting: Physical Therapy

## 2017-08-09 ENCOUNTER — Ambulatory Visit: Payer: Managed Care, Other (non HMO) | Attending: Adult Health | Admitting: Physical Therapy

## 2017-08-09 DIAGNOSIS — G8929 Other chronic pain: Secondary | ICD-10-CM

## 2017-08-09 DIAGNOSIS — R262 Difficulty in walking, not elsewhere classified: Secondary | ICD-10-CM | POA: Diagnosis present

## 2017-08-09 DIAGNOSIS — R293 Abnormal posture: Secondary | ICD-10-CM

## 2017-08-09 DIAGNOSIS — M5441 Lumbago with sciatica, right side: Secondary | ICD-10-CM | POA: Insufficient documentation

## 2017-08-09 DIAGNOSIS — M6281 Muscle weakness (generalized): Secondary | ICD-10-CM | POA: Diagnosis present

## 2017-08-09 NOTE — Patient Instructions (Addendum)
       Extension   Lie face down, hands close to chest. Press trunk up, arching back. Keep neck long, shoulders down. Tighten buttocks to protect lower back. Hold _5___ seconds. Repeat __5__ times. Do __1-2__ sessions per day. This helps with your pain  Backward Bend (Standing)   Arch backward to make hollow of back deeper. Hold _10___ seconds. Repeat _5___ times per set every hour  Do __1__ sets per session. Do __several _ sessions per day as needed to control pain  Copyright  VHI. All rights reserved.          Garen LahLawrie Margaux Engen, PT Certified Exercise Expert for the Aging Adult  08/09/17 8:20 AM Phone: [phone] Fax: [phone]

## 2017-08-09 NOTE — Therapy (Signed)
[facility] [address] [place], [place], [address] Phone: [phone]   Fax:  [phone]  Physical Therapy Treatment  Patient Details  Name: Pamela [name] MRN: [id number] Date of Birth: 08-06-1961 Referring Provider: Butch PennyMillikan, Megan NP   Encounter Date: 08/09/2017  PT End of Session - 08/09/17 0810    Visit Number  6    Number of Visits  13    Date for PT Re-Evaluation  08/23/17    Authorization Type  A[employer]    PT Start Time  [phone] came late so 35 min session    PT Stop Time  0855    PT Time Calculation (min)  45 min    Activity Tolerance  Patient tolerated treatment well    Behavior During Therapy  [facility] assessed/performed       Past Medical History:  Diagnosis Date  . Anxiety    takes Effexor  . Bilateral carpal tunnel syndrome 12/22/2015  . Bronchitis   . Depression   . 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 Family history of cancer    Pt reports family hx of breast, colon, throat and prostate, is watched closely but no diagnosis  . GERD (gastroesophageal reflux disease)    Pt reports chest pain about 5 years ago but GERD was contributing factor.  Marland Kitchen. Heart murmur    Pt denies seeing a cardiologist on a regular basis, heart studies done over 20 years ago.  Marland Kitchen. History of hiatal hernia   . Hx of degenerative disc disease   . Low back pain radiating to right leg 12/22/2015  . Pneumonia   . Scoliosis   . Seizures (HCC)    epilepsy, started at age 56  . Shortness of breath dyspnea   . 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    Past Surgical History:  Procedure Laterality Date  . ABDOMINAL HYSTERECTOMY    . BACK SURGERY     scoliosis  . EYE SURGERY     tear duct surgery  . LUMBAR LAMINECTOMY/DECOMPRESSION MICRODISCECTOMY N/A 07/29/2014   Procedure: LUMBAR LAMINECTOMY/DECOMPRESSION MICRODISCECTOMY;  Surgeon: Emilee HeroMark Leonard Dumonski, MD;  [facility];  Service:  Orthopedics;  Laterality: N/A;  Lumbar 4-5 decompression  . removed cysts from bilateral ovaries Bilateral 2010  . SHOULDER SURGERY Right    Removed bone spurs    There were no vitals filed for this visit.  Subjective Assessment - 08/09/17 0811    Subjective  I am doing well, I am actually more sore in my arms today than my back.  I am a 3/10 back.  I saw Dr. Ophelia CharterYates and he is scheduling an MRI on DEcember 15th for possible surgery.    Pertinent History  carpal tunnel, Scoliosis, DDD  Scoliosis with Harrington rod place T5 to L2, epilepsy,  See Medical hx     Limitations  Sitting;Standing;Walking;Lifting;House hold activities    How long can you sit comfortably?  30 to 45 minutes    Patient Stated Goals  relief from pain, pain management  strategies    Currently in Pain?  Yes    Pain Score  3     Pain Location  Back    Pain Orientation  Right;Left;Lower    Pain Type  Chronic pain    Pain Onset  More than a month ago    Pain Frequency  Intermittent    Pain Score  5    Pain Location  Neck    Pain Orientation  Right;Left    Pain Descriptors / Indicators  Nagging;Tightness    Pain Type  Chronic pain    Pain Onset  1 to 4 weeks ago    Pain Frequency  Constant                      [facility] Adult PT Treatment/Exercise - 08/09/17 0822      Lumbar Exercises: Stretches   Passive Hamstring Stretch  5 reps;10 seconds sciatic nerve glide with ankle pump      Lumbar Exercises: Standing   Other Standing Lumbar Exercises  standing extension x 5 and x 5 with right trunk rotation with right hand down back of thigh      Lumbar Exercises: Seated   Other Seated Lumbar Exercises  sciatic nerve glide in sitting x 10 with ankle pump      Lumbar Exercises: Supine   Ab Set  10 reps    Clam  15 reps red t band  feels better than on side x 2    Bridge  10 reps also 10 with ball squeeze    Other Supine Lumbar Exercises  pelvic tilt x 10 x 2      Lumbar Exercises: Sidelying   Clam  15  reps red t band right and left, pain on right with left clam      Lumbar Exercises: Prone   Straight Leg Raise  10 reps;3 seconds bil    Opposite Arm/Leg Raise  10 reps x 2  VC for abdominal control and neck postion    Other Prone Lumbar Exercises  Prone press up x 5 reduces pain and centralizes      Lumbar Exercises: Quadruped   Straight Leg Raise  3 seconds;10 reps right and left    Opposite Arm/Leg Raise  3 seconds;Right arm/Left leg;Left arm/Right leg;5 reps      Modalities   Modalities  Moist Heat      Moist Heat Therapy   Number Minutes Moist Heat  12 Minutes    Moist Heat Location  Lumbar Spine             PT Education - 08/09/17 9604    Education provided  Yes    Education Details  added sciatic nerve glides reinforced HEP    Person(s) Educated  Patient    Methods  Explanation;Demonstration;Tactile cues;Verbal cues    Comprehension  Verbalized understanding;Returned demonstration       PT Short Term Goals - 07/19/17 0952      PT SHORT TERM GOAL #1   Title  STG=LTG        PT Long Term Goals - 08/09/17 0815      PT LONG TERM GOAL #1   Title  "Demonstrate and verbalize techniques to reduce the risk of re-injury including: lifting, posture, body mechanics.     Baseline  Pt able to verbalize 3 strategies for pain management in order to do ADL's and body mechanics    Time  6    Period  Weeks    Status  Achieved      PT LONG TERM GOAL #2   Title  Pt will be independent with advance HEP     Baseline  Pt not consistent with exercises but will try this week    Time  6    Period  Weeks    Status  On-going      PT LONG TERM GOAL #3  Title  "Pt will tolerate sitting for 1 hour without increased pain to ride in car with pain level =/< 3/10    Baseline  Pt at 3/10 this am and can sit for 3- to 45 minutes same as last week    Time  6    Period  Weeks    Status  On-going      PT LONG TERM GOAL #4   Title  Pt will be able to return to aquatics/ exercise 5 x a  week without exacerbating pain > than 3/10    Baseline  Aquatics place not opern until January    Time  6    Period  Weeks    Status  On-going      PT LONG TERM GOAL #5   Title  "FOTO will improve from 55% liimitation   to 46% limitation    indicating improved functional mobility .     Time  6    Period  Weeks    Status  Unable to assess      PT LONG TERM GOAL #6   Title  Pt will be able to execute pain management strategies in order to centralize radiating pain above right knee in order to work full time at current job    Baseline  Pt uses lumbar roll and heel lift for pain management at work    Time  6    Period  Weeks    Status  On-going      PT LONG TERM GOAL #7   Title  Pt will improve MMT of LE's to all 4/5 in order to show improvements in mobility and core strength.    Baseline  P            Plan - 08/09/17 [id number]    Clinical Impression Statement  Pt at 4/10 pain but was educated on pain management at work.  Pt used prone extension  and standing extension to reduce pain from 4/10 to 2/10 in clinic today.  Pt arrived 10 minutes late to clinic but was able to go over exercises.  Pt had increased pain with left sidelying clam in  right LE and was changed to supien clam without pain but using red t band for resistance.  Pt confesses that she has not been as consistent with exercises this week.  but does feel stronger.  Pt saw DR Ophelia CharterYates and is schedule for neck MRI on December 15th.  Pt will make progress toward goals for low back HEP and be able to utilized independently before 08/23/17.  Pamela [name] had more complaints about arms today than back.  Pt states she has relief of pain with prone press ups and quadriped.   will continue toward completion of goals.  No goals achieved today.     Rehab Potential  Good    PT Frequency  2x / week    PT Duration  6 weeks    PT Treatment/Interventions  Cryotherapy;Electrical Stimulation;Moist Heat;Ultrasound;Neuromuscular  re-education;Functional mobility training;Patient/family education;Manual techniques;Passive range of motion;Dry needling;Taping    PT Next Visit Plan  REinforce HEP for pain management.  DO FOTO  check goals and solidify HEP for home use.  Pt for MRI of neck on Dec 15 so try to finish LB HEP before DC    PT Home Exercise Plan  supine clams with no pain, sidelying clams cause right LE pain,   sitting hamstring, decompression series , prone quadriped bird dog, childs pose , pelvic tilt ,  ab set, sicatic nerve glides.  standing extension and prone press ups.     Consulted and Agree with Plan of Care  Patient       Patient will benefit from skilled therapeutic intervention in order to improve the following deficits and impairments:  Difficulty walking, Pain, Postural dysfunction, Improper body mechanics, Increased fascial restricitons, Decreased range of motion, Decreased strength, Decreased activity tolerance  Visit Diagnosis: Chronic right-sided low back pain with right-sided sciatica  Muscle weakness (generalized)  Abnormal posture  Difficulty in walking, not elsewhere classified     Problem List Patient Active Problem List   Diagnosis Date Noted  . Bilateral carpal tunnel syndrome 12/22/2015  . Low back pain radiating to right leg 12/22/2015  . Epilepsy (HCC) 11/30/2015    Garen Lah, PT Certified Exercise Expert for the Aging Adult  08/09/17 9:03 AM Phone: [phone] Fax: [phone]  [facility] [address] [place], [place], [address] Phone: [phone]   Fax:  [phone]  Name: Pamela [name] MRN: [id number] Date of Birth: 27-Jun-1961

## 2017-08-14 ENCOUNTER — Ambulatory Visit: Payer: Managed Care, Other (non HMO) | Admitting: Physical Therapy

## 2017-08-14 NOTE — Telephone Encounter (Signed)
done

## 2017-08-16 ENCOUNTER — Ambulatory Visit: Payer: Managed Care, Other (non HMO) | Admitting: Physical Therapy

## 2017-08-17 ENCOUNTER — Ambulatory Visit
Admission: RE | Admit: 2017-08-17 | Discharge: 2017-08-17 | Disposition: A | Discharge status: Home / Self Care | Payer: Managed Care, Other (non HMO) | Source: Ambulatory Visit | Attending: Orthopaedic Surgery | Admitting: Orthopaedic Surgery

## 2017-08-17 DIAGNOSIS — M542 Cervicalgia: Secondary | ICD-10-CM

## 2017-08-21 ENCOUNTER — Ambulatory Visit: Payer: Managed Care, Other (non HMO) | Admitting: Physical Therapy

## 2017-08-21 ENCOUNTER — Encounter: Payer: Self-pay | Admitting: Physical Therapy

## 2017-08-21 DIAGNOSIS — R293 Abnormal posture: Secondary | ICD-10-CM

## 2017-08-21 DIAGNOSIS — R262 Difficulty in walking, not elsewhere classified: Secondary | ICD-10-CM

## 2017-08-21 DIAGNOSIS — M5441 Lumbago with sciatica, right side: Secondary | ICD-10-CM | POA: Diagnosis not present

## 2017-08-21 DIAGNOSIS — G8929 Other chronic pain: Secondary | ICD-10-CM

## 2017-08-21 DIAGNOSIS — M6281 Muscle weakness (generalized): Secondary | ICD-10-CM

## 2017-08-21 NOTE — Therapy (Signed)
[place], [place], [address] Phone: [phone]   Fax:  [phone]  Physical Therapy Treatment  Patient Details  Name: Pamela [name] MRN: [id number] Date of Birth: [birth year] Referring Provider: Ward Givens NP   Encounter Date: 08/21/2017  PT End of Session - 08/21/17 0812    Visit Number  7    Number of Visits  13    Date for PT Re-Evaluation  08/23/17    Authorization Type  Aetna    PT Start Time  0805    PT Stop Time  0900    PT Time Calculation (min)  55 min    Activity Tolerance  Patient tolerated treatment well    Behavior During Therapy  [facility] for tasks assessed/performed       Past Medical History:  Diagnosis Date  . Anxiety    takes Effexor  . Bilateral carpal tunnel syndrome 12/22/2015  . Bronchitis   . Depression   . Epilepsy ([place]) 11/30/2015  . Family history of cancer    Pt reports family hx of breast, colon, throat and prostate, is watched closely but no diagnosis  . GERD (gastroesophageal reflux disease)    Pt reports chest pain about 5 years ago but GERD was contributing factor.  Marland Kitchen Heart murmur    Pt denies seeing a cardiologist on a regular basis, heart studies done over 20 years ago.  Marland Kitchen History of hiatal hernia   . Hx of degenerative disc disease   . Low back pain radiating to right leg 12/22/2015  . Pneumonia   . Scoliosis   . Seizures ([place])    epilepsy, started at age 44  . Shortness of breath dyspnea   . Substance abuse ([place])    Pt reports "being clean" for 21 years, see social history.    Past Surgical History:  Procedure Laterality Date  . ABDOMINAL HYSTERECTOMY    . BACK SURGERY     scoliosis  . EYE SURGERY     tear duct surgery  . LUMBAR LAMINECTOMY/DECOMPRESSION MICRODISCECTOMY N/A 07/29/2014   Procedure: LUMBAR LAMINECTOMY/DECOMPRESSION MICRODISCECTOMY;  Surgeon: Sinclair Ship, MD;  Location: [facility];  Service: Orthopedics;  Laterality: N/A;   Lumbar 4-5 decompression  . removed cysts from bilateral ovaries Bilateral 2010  . SHOULDER SURGERY Right    Removed bone spurs    There were no vitals filed for this visit.  Subjective Assessment - 08/21/17 0807    Subjective  I had the MRI and I go back on Monday to talk to Dr. Lorin Mercy about results.     Pertinent History  carpal tunnel, Scoliosis, DDD  Scoliosis with Harrington rod place T5 to L2, epilepsy,  See Medical hx     Limitations  Sitting;Standing;Walking;Lifting;House hold activities    How long can you sit comfortably?  no more than 30 minutes now  lots of cutting of leather    How long can you stand comfortably?  30 minutes is max  especially leather cutting. I walk around and stretch and take a pain pill    How long can you walk comfortably?   max    Patient Stated Goals  relief from pain, pain management  strategies    Currently in Pain?  Yes    Pain Score  3     Pain Location  Back    Pain Orientation  Right;Left;Lower    Pain Descriptors / Indicators  Throbbing    Pain Type  Chronic pain  Pain Onset  More than a month ago    Pain Frequency  Constant    Pain Score  3    Pain Location  Neck    Pain Orientation  Right;Left    Pain Descriptors / Indicators  Tightness;Throbbing    Pain Onset  More than a month ago    Pain Frequency  Constant         [facility] PT Assessment - 08/21/17 0848      Observation/Other Assessments   Focus on Therapeutic Outcomes (FOTO)   FOTO intake 44% predicted 46%  limitation 56%  Eval 55%                  [facility] Adult PT Treatment/Exercise - 08/21/17 0834      Lumbar Exercises: Stretches   Quadruped Mid Back Stretch  3 reps;30 seconds to left side childs pose stretch 3 x 30 sec  pain relief      Lumbar Exercises: Aerobic   UBE (Upper Arm Bike)  4 minutes  2 forward and 2 back  level 2      Lumbar Exercises: Standing   Other Standing Lumbar Exercises  standing serratus wall flexion bil 15 x with yellow t band       Lumbar Exercises: Supine   Other Supine Lumbar Exercises  supine book opening,  x 10 right and left thoracic rotation limited as noted intially      Lumbar Exercises: Prone   Straight Leg Raise  10 reps;3 seconds bil    Opposite Arm/Leg Raise  10 reps x 2  VC for abdominal control and neck postion    Other Prone Lumbar Exercises  Prone press up x 5 reduces pain and centralizes      Lumbar Exercises: Quadruped   Straight Leg Raise  3 seconds;10 reps right and left    Opposite Arm/Leg Raise  3 seconds;Right arm/Left leg;Left arm/Right leg;5 reps      Shoulder Exercises: Standing   External Rotation  Strengthening;Both;15 reps    Theraband Level (Shoulder External Rotation)  Level 2 (Red)    External Rotation Limitations  x 2 VC and TC    Extension  Strengthening;Both;15 reps;Theraband    Theraband Level (Shoulder Extension)  Level 2 (Red)    Extension Limitations  x 2    Row  Strengthening;Both;15 reps;Theraband    Theraband Level (Shoulder Row)  Level 2 (Red)    Row Limitations  x 2      Modalities   Modalities  Moist Heat      Moist Heat Therapy   Number Minutes Moist Heat  12 Minutes    Moist Heat Location  Lumbar Spine;Cervical             PT Education - 08/21/17 0826    Education provided  Yes    Education Details  added to HEP for scapular stabilizers and serratus anterior.     Person(s) Educated  Patient    Methods  Explanation;Demonstration;Tactile cues;Verbal cues;Handout    Comprehension  Verbalized understanding;Returned demonstration       PT Short Term Goals - 07/19/17 0952      PT SHORT TERM GOAL #1   Title  STG=LTG        PT Long Term Goals - 08/21/17 0849      PT LONG TERM GOAL #1   Title  "Demonstrate and verbalize techniques to reduce the risk of re-injury including: lifting, posture, body mechanics.     Baseline  Pt  able to verbalize 3 strategies for pain management in order to do ADL's and body mechanics    Time  6    Period  Weeks     Status  Achieved      PT LONG TERM GOAL #2   Title  Pt will be independent with advance HEP     Baseline  reinforcing HEP    Time  6    Period  Weeks    Status  On-going      PT LONG TERM GOAL #3   Title  "Pt will tolerate sitting for 1 hour without increased pain to ride in car with pain level =/< 3/10    Baseline  Pt at 3/10 this am and can only tolerate up to 30 minutes    Time  6    Period  Weeks    Status  On-going      PT LONG TERM GOAL #4   Title  Pt will be able to return to aquatics/ exercise 5 x a week without exacerbating pain > than 3/10    Baseline  Aquatics place not opern until January    Time  6    Period  Weeks    Status  Deferred      PT LONG TERM GOAL #5   Title  "FOTO will improve from 55% liimitation   to 46% limitation    indicating improved functional mobility .     Baseline  FOTO today back to 56% limitation eval was 55% limitation    Time  6    Period  Weeks    Status  Not Met      PT LONG TERM GOAL #6   Title  Pt will be able to execute pain management strategies in order to centralize radiating pain above right knee in order to work full time at current job    Baseline  Pt uses lumbar roll and heel lift for pain management at work and using movement after 30 minutes standing , walking and sitting    Time  6    Period  Weeks    Status  Achieved      PT LONG TERM GOAL #7   Title  Pt will improve MMT of LE's to all 4/5 in order to show improvements in mobility and core strength.    Period  Weeks    Status  Unable to assess            Plan - 08/21/17 0839    Clinical Impression Statement  Pt is 3/10 pain today.  Still shooting up to 10/10. Pt to see Dr. Lorin Mercy next Monday.  Pt is finalizing HEP today and will see PT on Thursday for final visit to reinforce home program.  Pt still with pain exacerbated by work duties .  Will re assess goals next visit and discharge back to Dr. Lorin Mercy to assess for possible further intervention.      Rehab  Potential  Good    PT Frequency  2x / week    PT Duration  6 weeks    PT Treatment/Interventions  Cryotherapy;Electrical Stimulation;Moist Heat;Ultrasound;Neuromuscular re-education;Functional mobility training;Patient/family education;Manual techniques;Passive range of motion;Dry needling;Taping    PT Next Visit Plan  REinforce HEP for pain management.   FOTO done last visit  check goals and solidify HEP for home use.  DC next visit 12/20    PT Home Exercise Plan  supine clams with no pain, sidelying clams cause right LE pain,  sitting hamstring, decompression series , prone quadriped bird dog, childs pose , pelvic tilt , ab set, sicatic nerve glides.  standing extension and prone press ups. standing scapular stabiliizer and open book  serratus with yellow t band    Consulted and Agree with Plan of Care  Patient       Patient will benefit from skilled therapeutic intervention in order to improve the following deficits and impairments:  Difficulty walking, Pain, Postural dysfunction, Improper body mechanics, Increased fascial restricitons, Decreased range of motion, Decreased strength, Decreased activity tolerance  Visit Diagnosis: Chronic right-sided low back pain with right-sided sciatica  Muscle weakness (generalized)  Abnormal posture  Difficulty in walking, not elsewhere classified     Problem List Patient Active Problem List   Diagnosis Date Noted  . Bilateral carpal tunnel syndrome 12/22/2015  . Low back pain radiating to right leg 12/22/2015  . Epilepsy ([place]) 11/30/2015   Voncille Lo, PT Certified Exercise Expert for the Aging Adult  08/21/17 11:38 AM Phone: [phone] Fax: [facility] [address] [place], [place], [address] Phone: [phone]   Fax:  [phone]  Name: Pamela [name] MRN: [id number] Date of Birth: December 20, 1960

## 2017-08-21 NOTE — Patient Instructions (Signed)
   Garen LahLawrie Beardsley, PT Certified Exercise Expert for the Aging Adult  08/21/17 8:26 AM Phone: [phone] Fax: [phone]

## 2017-08-22 ENCOUNTER — Ambulatory Visit (INDEPENDENT_AMBULATORY_CARE_PROVIDER_SITE_OTHER): Payer: Managed Care, Other (non HMO) | Admitting: Orthopaedic Surgery

## 2017-08-22 ENCOUNTER — Encounter (INDEPENDENT_AMBULATORY_CARE_PROVIDER_SITE_OTHER): Payer: Self-pay | Admitting: Orthopaedic Surgery

## 2017-08-22 VITALS — BP 89/56 | HR 52 | Ht 67.0 in | Wt 148.0 lb

## 2017-08-22 DIAGNOSIS — M47812 Spondylosis without myelopathy or radiculopathy, cervical region: Secondary | ICD-10-CM

## 2017-08-22 MED ORDER — PREDNISONE 10 MG PO TABS: ORAL_TABLET | ORAL | 0 refills | Status: DC

## 2017-08-22 NOTE — Progress Notes (Signed)
Office Visit Note   Patient: Pamela AmorJacqueline D Mcmartin           Date of Birth: [birth year]           MRN: [id number] Visit Date: 08/22/2017              Requested by: Clayborn Heronankins, Victoria R, MD [address] [place], [place] [address] PCP: Clayborn Heronankins, Victoria R, MD   Assessment & Plan: Visit Diagnoses:  1. Spondylosis without myelopathy or radiculopathy, cervical region     Plan: Prednisone Dosepak 10 mg number 21 tablets given and discussed appropriate usage.  Her neck shows some mild cervical spondylosis and with her carpal tunnel syndrome problems and significant problems with tingling and numbness in her hands I recommend she return to see Dr. Merlyn LotKuzma and proceed with carpal tunnel release.  She can follow-up with me if she has persistent problems after that procedure.  We discussed the lumbar disc degeneration that was present on previous MRI scan below her fusion thoracolumbar down to L3 with her previous surgery at L4-5.  She is having symptoms on the left leg opposite for where she had a far lateral disc extrusion on the right at the L4-5 level surgically treated by Dr. Yevette Edwardsumonski in 2015.  For lumbar symptoms get worse she will let me know if she would like to proceed with lumbar MRI scan.  Follow-Up Instructions: No Follow-up on file.   Orders:  No orders of the defined types were placed in this encounter.  Meds ordered this encounter  Medications  . predniSONE (DELTASONE) 10 MG tablet    Sig: Take as instructed.    Dispense:  21 tablet    Refill:  0      Procedures: No procedures performed   Clinical Data: No additional findings.   Subjective: Chief Complaint  Patient presents with  . Neck - Pain, Follow-up    MRI review  . Lower Back - Pain    HPI patient returns and states she is having considerable problems with pain and is tearful.  She is wearing her wrist splints for carpal tunnel she has had MRI scan of her neck which showed some mild spinal stenosis at C5-6  and C6-7.  She also had some narrowing at C7-T1 ,T2-3 and bilaterally at T1-T2.  She is wearing her splint she states she is at the end of her rope and really cannot tolerate the pain any longer.  She has had back pain and left hip pain that radiates down her leg.  Previous surgery 2015 with an extraforaminal disc herniation treated by Dr. Yevette Edwardsumonski on the right side at L4-5.  Previous Harrington rod placement with fusion with Harrington rod fusion in [place] 43 years ago.  She is fused to the L3 level.  Review of Systems updated from last office visit and unchanged other than as mentioned in HPI.   Objective: Vital Signs: BP (!) 89/56   Pulse (!) 52   Ht 5\' 7"  (1.702 m)   Wt 148 lb (67.1 kg)   BMI 23.18 kg/m   Physical Exam  Ortho Exam  Specialty Comments:  No specialty comments available.  Imaging: CLINICAL DATA:  Neck pain radiating into both shoulders and arms into the hands. Associated weakness. Numbness in the upper and lower extremities.  EXAM: MRI CERVICAL SPINE WITHOUT CONTRAST  TECHNIQUE: Multiplanar, multisequence MR imaging of the cervical spine was performed. No intravenous contrast was administered.  COMPARISON:  Cervical spine radiographs 08/07/2017  FINDINGS: Alignment:  Cervical spine straightening.  No listhesis.  Vertebrae: No fracture or destructive osseous lesion. Minimal degenerative endplate edema in the lower cervical and upper thoracic spine.  Cord: Normal signal and morphology.  Posterior Fossa, vertebral arteries, paraspinal tissues: Unremarkable.  Disc levels:  C2-3:  Mild uncovertebral spurring without stenosis.  C3-4: Mild disc bulging and uncovertebral spurring without stenosis.  C4-5:  Mild disc bulging without stenosis.  C5-6: Mild disc space narrowing. Disc bulging and uncovertebral spurring result in mild spinal stenosis without neural foraminal stenosis.  C6-7: Mild disc space narrowing. Disc bulging and  uncovertebral spurring result in mild spinal stenosis without neural foraminal stenosis.  C7-T1: Mild disc space narrowing. Disc bulging and uncovertebral spurring result in moderate right and borderline left neural foraminal stenosis without spinal stenosis. Potential right C8 nerve root impingement.  T1-2: Mild disc space narrowing. Disc bulging and spurring result in moderate bilateral neural foraminal stenosis without spinal stenosis.  T2-3: Mild disc space narrowing. Disc bulging asymmetric to the right and moderate right facet arthrosis result in moderate right neural foraminal stenosis. No spinal stenosis.  IMPRESSION: 1. Multilevel cervical disc degeneration resulting in mild spinal stenosis at C5-6 and C6-7. 2. Moderate neural foraminal stenosis on the right at C7-T1 and T2-3 and bilaterally at T1-2.   Electronically Signed   By: Sebastian Ache M.D.   On: 08/17/2017 17:19   PMFS History: Patient Active Problem List   Diagnosis Date Noted  . Bilateral carpal tunnel syndrome 12/22/2015  . Low back pain radiating to right leg 12/22/2015  . Epilepsy (HCC) 11/30/2015   Past Medical History:  Diagnosis Date  . Anxiety    takes Effexor  . Bilateral carpal tunnel syndrome 12/22/2015  . Bronchitis   . Depression   . 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 Family history of cancer    Pt reports family hx of breast, colon, throat and prostate, is watched closely but no diagnosis  . GERD (gastroesophageal reflux disease)    Pt reports chest pain about 5 years ago but GERD was contributing factor.  Marland Kitchen Heart murmur    Pt denies seeing a cardiologist on a regular basis, heart studies done over 20 years ago.  Marland Kitchen History of hiatal hernia   . Hx of degenerative disc disease   . Low back pain radiating to right leg 12/22/2015  . Pneumonia   . Scoliosis   . Seizures (HCC)    epilepsy, started at age 56  . Shortness of breath dyspnea   . 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    Family History  Problem Relation Age of Onset  . Prostate cancer Father   . Diabetes Father   . Breast cancer Mother   . Prostate cancer Brother   . Hepatitis C Brother   . Seizures Neg Hx     Past Surgical History:  Procedure Laterality Date  . ABDOMINAL HYSTERECTOMY    . BACK SURGERY     scoliosis  . EYE SURGERY     tear duct surgery  . LUMBAR LAMINECTOMY/DECOMPRESSION MICRODISCECTOMY N/A 07/29/2014   Procedure: LUMBAR LAMINECTOMY/DECOMPRESSION MICRODISCECTOMY;  Surgeon: Emilee Hero, MD;  Location: [facility] OR;  Service: Orthopedics;  Laterality: N/A;  Lumbar 4-5 decompression  . removed cysts from bilateral ovaries Bilateral 2010  . SHOULDER SURGERY Right    Removed bone spurs   Social History   Occupational History  . Not on file  Tobacco Use  .  Smoking status: Current Every Day Smoker    Packs/day: 1.00    Types: Cigarettes  . Smokeless tobacco: Never Used  Substance and Sexual Activity  . Alcohol use: No    Comment: history of abuse, sober for 21 years  . 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 Sexual activity: Not on file

## 2017-08-23 ENCOUNTER — Ambulatory Visit: Payer: Managed Care, Other (non HMO) | Admitting: Physical Therapy

## 2017-08-23 ENCOUNTER — Encounter: Payer: Self-pay | Admitting: Physical Therapy

## 2017-08-23 DIAGNOSIS — R262 Difficulty in walking, not elsewhere classified: Secondary | ICD-10-CM

## 2017-08-23 DIAGNOSIS — M5441 Lumbago with sciatica, right side: Principal | ICD-10-CM

## 2017-08-23 DIAGNOSIS — M6281 Muscle weakness (generalized): Secondary | ICD-10-CM

## 2017-08-23 DIAGNOSIS — R293 Abnormal posture: Secondary | ICD-10-CM

## 2017-08-23 DIAGNOSIS — G8929 Other chronic pain: Secondary | ICD-10-CM

## 2017-08-23 NOTE — Therapy (Signed)
[place], [place], [address] Phone: [phone]   Fax:  [phone]  Physical Therapy Treatment/Discharge Note  Patient Details  Name: Pamela [name] MRN: [id number] Date of Birth: 05-Sep-1960 Referring Provider: Ward Givens, NP   Encounter Date: 08/23/2017  PT End of Session - 08/23/17 0832    Visit Number  8    Number of Visits  13    Date for PT Re-Evaluation  08/23/17    Authorization Type  [employer]    PT Start Time  0800    PT Stop Time  0858    PT Time Calculation (min)  58 min    Activity Tolerance  Patient tolerated treatment well    Behavior During Therapy  [facility] for tasks assessed/performed       Past Medical History:  Diagnosis Date  . Anxiety    takes Effexor  . Bilateral carpal tunnel syndrome 12/22/2015  . Bronchitis   . Depression   . Epilepsy ([place]) 11/30/2015  . Family history of cancer    Pt reports family hx of breast, colon, throat and prostate, is watched closely but no diagnosis  . GERD (gastroesophageal reflux disease)    Pt reports chest pain about 5 years ago but GERD was contributing factor.  Marland Kitchen Heart murmur    Pt denies seeing a cardiologist on a regular basis, heart studies done over 20 years ago.  Marland Kitchen History of hiatal hernia   . Hx of degenerative disc disease   . Low back pain radiating to right leg 12/22/2015  . Pneumonia   . Scoliosis   . Seizures ([place])    epilepsy, started at age 20  . Shortness of breath dyspnea   . Substance abuse ([place])    Pt reports "being clean" for 21 years, see social history.    Past Surgical History:  Procedure Laterality Date  . ABDOMINAL HYSTERECTOMY    . BACK SURGERY     scoliosis  . EYE SURGERY     tear duct surgery  . LUMBAR LAMINECTOMY/DECOMPRESSION MICRODISCECTOMY N/A 07/29/2014   Procedure: LUMBAR LAMINECTOMY/DECOMPRESSION MICRODISCECTOMY;  Surgeon: Sinclair Ship, MD;  Location: Uncertain;  Service: Orthopedics;   Laterality: N/A;  Lumbar 4-5 decompression  . removed cysts from bilateral ovaries Bilateral 2010  . SHOULDER SURGERY Right    Removed bone spurs    There were no vitals filed for this visit.  Subjective Assessment - 08/23/17 0808    Subjective  I saw Dr. Lorin Mercy yesterday and he said my Cervical stenosis is mild and we should wait on surgery but we are going to proceed with carpal tunnel surgery . I am first going to see Dr. Fredna Dow on [facility]    Pertinent History  carpal tunnel, Scoliosis, DDD  Scoliosis with Harrington rod place T5 to L2, epilepsy,  See Medical hx     Limitations  Sitting;Standing;Walking;Lifting;House hold activities    How long can you sit comfortably?  no more than 30 minutes now  lots of cutting of leather    How long can you stand comfortably?  30 minutes is max  especially leather cutting. I walk around and stretch and take a pain pill    How long can you walk comfortably?  max is 30 minutes before pain but I stand and shift up to 2 hours at my work    Diagnostic tests  x rays one year ago.  current ones ordered    Patient Stated Goals  relief from pain, pain management  strategies    Currently in Pain?  Yes    Pain Score  5     Pain Location  Back    Pain Orientation  Left    Pain Descriptors / Indicators  Throbbing;Tingling    Pain Radiating Towards  into legs and calves down to feet and hands    Pain Onset  More than a month ago    Pain Frequency  Constant    Pain Score  5    Pain Location  Neck    Pain Orientation  Right;Left    Pain Descriptors / Indicators  Tightness;Tingling;Throbbing    Pain Onset  More than a month ago    Pain Frequency  Constant         [facility] PT Assessment - 08/23/17 0825      Assessment   Medical Diagnosis  chronic right sided back pain with right sided right leg down to feet at night    Referring Provider  Ward Givens, NP      Observation/Other Assessments   Focus on Therapeutic Outcomes (FOTO)   FOTO intake 44% predicted  46%  limitation 56%  Eval 55% taken on 12/18-18      AROM   Overall AROM   Deficits    Lumbar Flexion  65    Lumbar Extension  15 pain    Lumbar - Right Side Bend  15 pain on right    Lumbar - Left Side Bend  13 pain on right    Lumbar - Right Rotation  limited 50%    Lumbar - Left Rotation  limited 50 % may be end range due to Harrington rod placement      Strength   Overall Strength  Deficits    Right Hip Flexion  5/5    Right Hip Extension  4/5    Right Hip ABduction  4/5    Left Hip Flexion  4/5    Left Hip Extension  4/5    Left Hip ABduction  5/5    Right Knee Flexion  5/5    Right Knee Extension  5/5    Left Knee Flexion  5/5    Left Knee Extension  5/5    Right Ankle Dorsiflexion  5/5    Left Ankle Dorsiflexion  5/5      Palpation   Palpation comment  still tender overright lumbosacral but decreased from eval                  [facility] Adult PT Treatment/Exercise - 08/23/17 0824      Lumbar Exercises: Supine   Ab Set  10 reps    Clam  20 reps green t band    Bridge  20 reps;3 seconds    Other Supine Lumbar Exercises  pelvic tilt x 10 x 2. decompression exercises sequence    Other Supine Lumbar Exercises  supine book opening,  x 10 right and left thoracic rotation limited as noted intially      Lumbar Exercises: Sidelying   Clam  20 reps bil with green t band      Lumbar Exercises: Prone   Other Prone Lumbar Exercises  Prone press up x 5 reduces pain and centralizes      Lumbar Exercises: Quadruped   Straight Leg Raise  3 seconds;20 reps    Opposite Arm/Leg Raise  20 reps;3 seconds;Right arm/Left leg;Left arm/Right leg      Shoulder Exercises: Standing  External Rotation  Strengthening;Both;15 reps    Theraband Level (Shoulder External Rotation)  Level 2 (Red)    External Rotation Limitations  x 2 VC and TC    Extension  Strengthening;Both;15 reps;Theraband    Theraband Level (Shoulder Extension)  Level 2 (Red)    Extension Limitations  x 2     Row  Strengthening;Both;15 reps;Theraband    Theraband Level (Shoulder Row)  Level 2 (Red)    Row Limitations  x 2    Other Standing Exercises  neural glides 10 times each bil for radial, medial and ulnar nerves      Modalities   Modalities  Moist Heat      Moist Heat Therapy   Number Minutes Moist Heat  15 Minutes    Moist Heat Location  Lumbar Spine;Cervical             PT Education - 08/23/17 0818    Education provided  Yes    Education Details  Reviewed HEP and added Neural glides radial, medial and ulnar    Person(s) Educated  Patient    Methods  Explanation;Demonstration;Tactile cues;Verbal cues;Handout    Comprehension  Verbalized understanding;Returned demonstration       PT Short Term Goals - 08/23/17 0831      PT SHORT TERM GOAL #1   Title  STG=LTG        PT Long Term Goals - 08/23/17 0831      PT LONG TERM GOAL #1   Title  "Demonstrate and verbalize techniques to reduce the risk of re-injury including: lifting, posture, body mechanics.     Baseline  Pt able to verbalize 3 strategies for pain management in order to do ADL's and body mechanics    Time  6    Period  Weeks    Status  Achieved      PT LONG TERM GOAL #2   Title  Pt will be independent with advance HEP     Baseline  pt is compliant and diligent to perform exercises for strength and flexibiilty    Time  6    Period  Weeks    Status  Achieved      PT LONG TERM GOAL #3   Title  "Pt will tolerate sitting for 1 hour without increased pain to ride in car with pain level =/< 3/10    Baseline  Pt at 5/10 this am and can only tolerate up to 30 minutes    Time  6    Period  Weeks    Status  Partially Met      PT LONG TERM GOAL #4   Title  Pt will be able to return to aquatics/ exercise 5 x a week without exacerbating pain > than 3/10    Baseline  Aquatics place not opern until January. Aquatics is the best exercise for pt    Time  6    Period  Weeks    Status  Deferred      PT LONG TERM  GOAL #5   Title  "FOTO will improve from 55% liimitation   to 46% limitation    indicating improved functional mobility .     Baseline  FOTO today back to 56% limitation eval was 55% limitation    Time  6    Period  Weeks    Status  Not Met      PT LONG TERM GOAL #6   Title  Pt will be able to execute pain  management strategies in order to centralize radiating pain above right knee in order to work full time at current job    Baseline  Pt uses lumbar roll and heel lift for pain management at work and using movement after 30 minutes standing , walking and sitting, utilizing work space , aware of proper lifting and laundry and transition movements    Time  6    Period  Weeks    Status  Achieved      PT LONG TERM GOAL #7   Title  Pt will improve MMT of LE's to all 4/5 in order to show improvements in mobility and core strength.    Baseline  See flowsheet.  Pt is 4/5 in all MMT to 5/5   4/5 hip extension    Time  6    Period  Weeks    Status  Achieved            Plan - 08/23/17 0843    Clinical Impression Statement  Pt has varied with 3/10 to 5/10 pain level this week.  She saw Dr. Lorin Mercy for cervical pain and was classified with mild stenosis.  Pt will be scheduled for carpal tunnel exercise after the new year and will consult with Dr. Fredna Dow.  she has completed all LTG and partially met or met all except for FOTO goal which remained constant.  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  Pt is aware and utilizes pain management strategies and is Independeing with HEP . She is ready for discharge and will continue with community wellness at [facility] center.     PT Frequency  2x / week    PT Duration  6 weeks    PT Treatment/Interventions  Cryotherapy;Electrical Stimulation;Moist Heat;Ultrasound;Neuromuscular re-education;Functional mobility training;Patient/family education;Manual techniques;Passive range of motion;Dry needling;Taping    PT Next Visit Plan   DC    PT Home Exercise Plan  supine clams with no pain, sidelying clams cause right LE pain,   sitting hamstring, decompression series , prone quadriped bird dog, childs pose , pelvic tilt , ab set, sicatic nerve glides.  standing extension and prone press ups. standing scapular stabiliizer and open book  serratus with yellow t band. medical, radial and ulna nerve glides    Consulted and Agree with Plan of Care  Patient       Patient will benefit from skilled therapeutic intervention in order to improve the following deficits and impairments:  Difficulty walking, Pain, Postural dysfunction, Improper body mechanics, Increased fascial restricitons, Decreased range of motion, Decreased strength, Decreased activity tolerance  Visit Diagnosis: Chronic right-sided low back pain with right-sided sciatica  Muscle weakness (generalized)  Abnormal posture  Difficulty in walking, not elsewhere classified     Problem List Patient Active Problem List   Diagnosis Date Noted  . Bilateral carpal tunnel syndrome 12/22/2015  . Low back pain radiating to right leg 12/22/2015  . Epilepsy ([place]) 11/30/2015    Voncille Lo, PT Certified Exercise Expert for the Aging Adult  08/23/17 1:40 PM Phone: [phone] Fax: [facility] [address] [place], [place], [address] Phone: [phone]   Fax:  [phone]  Name: Pamela [name] MRN: [id number] Date of Birth: October 27, 1960   PHYSICAL THERAPY DISCHARGE SUMMARY  Visits from Start of Care: 8  Current functional level related to goals / functional outcomes: See above   Remaining deficits: Continued back and neck pain.  Scheduling for  carpal tunnel surgery   Education / Equipment: HEP and continuing strength/ exercise in aquatics Plan: Patient agrees to discharge.  Patient goals were partially met. Patient is being discharged due to meeting the stated rehab goals.  ?????     Meeting most goals and independence with HEP  Voncille Lo, PT Certified Exercise Expert for the Aging Adult  08/23/17 1:42 PM Phone: [phone] Fax: [phone]

## 2017-08-23 NOTE — Patient Instructions (Addendum)
   Bridge    Lie back, legs bent. Inhale, pressing hips up. Keeping ribs in, lengthen lower back. Exhale, rolling down along spine from top. Repeat _20___ times. Do _1-2___ sessions per day.  Can do it with one leg and then lift up buttock when this is easy.  http://pm.exer.us/55   Copyright  VHI. All rights reserved.   Garen LahLawrie Sheana Bir, PT Certified Exercise Expert for the Aging Adult  08/23/17 8:18 AM Phone: [phone] Fax: [phone]

## 2017-09-04 DIAGNOSIS — G5601 Carpal tunnel syndrome, right upper limb: Secondary | ICD-10-CM

## 2017-09-04 HISTORY — DX: Carpal tunnel syndrome, right upper limb: G56.01

## 2017-09-10 ENCOUNTER — Encounter (INDEPENDENT_AMBULATORY_CARE_PROVIDER_SITE_OTHER): Payer: Self-pay | Admitting: Orthopaedic Surgery

## 2017-09-13 ENCOUNTER — Other Ambulatory Visit: Payer: Self-pay | Admitting: Orthopedic Surgery

## 2017-09-28 ENCOUNTER — Encounter (HOSPITAL_BASED_OUTPATIENT_CLINIC_OR_DEPARTMENT_OTHER): Payer: Self-pay | Admitting: *Deleted

## 2017-09-28 ENCOUNTER — Other Ambulatory Visit: Payer: Self-pay

## 2017-10-04 ENCOUNTER — Ambulatory Visit (HOSPITAL_BASED_OUTPATIENT_CLINIC_OR_DEPARTMENT_OTHER): Payer: Managed Care, Other (non HMO) | Admitting: Anesthesiology

## 2017-10-04 ENCOUNTER — Ambulatory Visit (HOSPITAL_BASED_OUTPATIENT_CLINIC_OR_DEPARTMENT_OTHER)
Admission: RE | Admit: 2017-10-04 | Discharge: 2017-10-04 | Disposition: A | Discharge status: Home / Self Care | Payer: Managed Care, Other (non HMO) | Source: Ambulatory Visit | Attending: Orthopedic Surgery | Admitting: Orthopedic Surgery

## 2017-10-04 ENCOUNTER — Encounter (HOSPITAL_BASED_OUTPATIENT_CLINIC_OR_DEPARTMENT_OTHER): Payer: Self-pay | Admitting: *Deleted

## 2017-10-04 ENCOUNTER — Other Ambulatory Visit: Payer: Self-pay

## 2017-10-04 ENCOUNTER — Encounter (HOSPITAL_BASED_OUTPATIENT_CLINIC_OR_DEPARTMENT_OTHER)
Admission: RE | Disposition: A | Discharge status: Home / Self Care | Payer: Self-pay | Source: Ambulatory Visit | Attending: Orthopedic Surgery

## 2017-10-04 DIAGNOSIS — K219 Gastro-esophageal reflux disease without esophagitis: Secondary | ICD-10-CM | POA: Insufficient documentation

## 2017-10-04 DIAGNOSIS — Z8711 Personal history of peptic ulcer disease: Secondary | ICD-10-CM | POA: Insufficient documentation

## 2017-10-04 DIAGNOSIS — E739 Lactose intolerance, unspecified: Secondary | ICD-10-CM | POA: Diagnosis not present

## 2017-10-04 DIAGNOSIS — M1990 Unspecified osteoarthritis, unspecified site: Secondary | ICD-10-CM | POA: Insufficient documentation

## 2017-10-04 DIAGNOSIS — M419 Scoliosis, unspecified: Secondary | ICD-10-CM | POA: Insufficient documentation

## 2017-10-04 DIAGNOSIS — G5622 Lesion of ulnar nerve, left upper limb: Secondary | ICD-10-CM | POA: Insufficient documentation

## 2017-10-04 DIAGNOSIS — G5601 Carpal tunnel syndrome, right upper limb: Secondary | ICD-10-CM | POA: Insufficient documentation

## 2017-10-04 DIAGNOSIS — Z886 Allergy status to analgesic agent status: Secondary | ICD-10-CM | POA: Insufficient documentation

## 2017-10-04 DIAGNOSIS — F329 Major depressive disorder, single episode, unspecified: Secondary | ICD-10-CM | POA: Insufficient documentation

## 2017-10-04 DIAGNOSIS — Z888 Allergy status to other drugs, medicaments and biological substances status: Secondary | ICD-10-CM | POA: Diagnosis not present

## 2017-10-04 DIAGNOSIS — Z8719 Personal history of other diseases of the digestive system: Secondary | ICD-10-CM | POA: Insufficient documentation

## 2017-10-04 DIAGNOSIS — F419 Anxiety disorder, unspecified: Secondary | ICD-10-CM | POA: Diagnosis not present

## 2017-10-04 DIAGNOSIS — G40909 Epilepsy, unspecified, not intractable, without status epilepticus: Secondary | ICD-10-CM | POA: Diagnosis not present

## 2017-10-04 DIAGNOSIS — F17210 Nicotine dependence, cigarettes, uncomplicated: Secondary | ICD-10-CM | POA: Insufficient documentation

## 2017-10-04 HISTORY — DX: Unspecified osteoarthritis, unspecified site: M19.90

## 2017-10-04 HISTORY — PX: CARPAL TUNNEL RELEASE: SHX101

## 2017-10-04 HISTORY — DX: Mononeuropathy, unspecified: G58.9

## 2017-10-04 HISTORY — DX: Other seasonal allergic rhinitis: J30.2

## 2017-10-04 HISTORY — DX: Carpal tunnel syndrome, right upper limb: G56.01

## 2017-10-04 HISTORY — DX: Gastric ulcer, unspecified as acute or chronic, without hemorrhage or perforation: K25.9

## 2017-10-04 HISTORY — DX: Personal history of other diseases of the musculoskeletal system and connective tissue: Z87.39

## 2017-10-04 HISTORY — DX: Presence of dental prosthetic device (complete) (partial): Z97.2

## 2017-10-04 HISTORY — DX: Other psychoactive substance abuse, in remission: F19.11

## 2017-10-04 HISTORY — DX: Other intervertebral disc degeneration, lumbar region: M51.36

## 2017-10-04 HISTORY — DX: Other intervertebral disc degeneration, lumbar region without mention of lumbar back pain or lower extremity pain: M51.369

## 2017-10-04 SURGERY — CARPAL TUNNEL RELEASE
Anesthesia: Monitor Anesthesia Care | Site: Wrist | Laterality: Right

## 2017-10-04 MED ORDER — PROPOFOL 10 MG/ML IV BOLUS
INTRAVENOUS | Status: DC | PRN
  Administered 2017-10-04: 20 mg via INTRAVENOUS

## 2017-10-04 MED ORDER — MIDAZOLAM HCL 2 MG/2ML IJ SOLN
1.0000 mg | INTRAMUSCULAR | Status: DC | PRN
  Administered 2017-10-04: 2 mg via INTRAVENOUS

## 2017-10-04 MED ORDER — BUPIVACAINE HCL (PF) 0.25 % IJ SOLN
INTRAMUSCULAR | Status: DC | PRN
  Administered 2017-10-04: 9 mL

## 2017-10-04 MED ORDER — ONDANSETRON HCL 4 MG/2ML IJ SOLN
INTRAMUSCULAR | Status: DC | PRN
  Administered 2017-10-04: 4 mg via INTRAVENOUS

## 2017-10-04 MED ORDER — SCOPOLAMINE 1 MG/3DAYS TD PT72: 1.0000 | MEDICATED_PATCH | Freq: Once | TRANSDERMAL | Status: DC | PRN

## 2017-10-04 MED ORDER — LIDOCAINE HCL (CARDIAC) 20 MG/ML IV SOLN
INTRAVENOUS | Status: DC | PRN
  Administered 2017-10-04: 30 mg via INTRAVENOUS

## 2017-10-04 MED ORDER — CEFAZOLIN SODIUM-DEXTROSE 2-4 GM/100ML-% IV SOLN
2.0000 g | INTRAVENOUS | Status: AC
  Administered 2017-10-04: 2 g via INTRAVENOUS

## 2017-10-04 MED ORDER — CHLORHEXIDINE GLUCONATE 4 % EX LIQD: 60.0000 mL | Freq: Once | CUTANEOUS | Status: DC

## 2017-10-04 MED ORDER — FENTANYL CITRATE (PF) 100 MCG/2ML IJ SOLN
INTRAMUSCULAR | Status: AC
  Filled 2017-10-04: qty 2

## 2017-10-04 MED ORDER — CEFAZOLIN SODIUM-DEXTROSE 2-4 GM/100ML-% IV SOLN
INTRAVENOUS | Status: AC
  Filled 2017-10-04: qty 100

## 2017-10-04 MED ORDER — FENTANYL CITRATE (PF) 100 MCG/2ML IJ SOLN
50.0000 ug | INTRAMUSCULAR | Status: DC | PRN
  Administered 2017-10-04: 100 ug via INTRAVENOUS

## 2017-10-04 MED ORDER — MIDAZOLAM HCL 2 MG/2ML IJ SOLN
INTRAMUSCULAR | Status: AC
  Filled 2017-10-04: qty 2

## 2017-10-04 MED ORDER — HYDROCODONE-ACETAMINOPHEN 5-325 MG PO TABS: 1.0000 | ORAL_TABLET | Freq: Four times a day (QID) | ORAL | 0 refills | Status: DC | PRN

## 2017-10-04 MED ORDER — LACTATED RINGERS IV SOLN
INTRAVENOUS | Status: DC
  Administered 2017-10-04 (×2): via INTRAVENOUS

## 2017-10-04 SURGICAL SUPPLY — 31 items
BLADE SURG 15 STRL LF DISP TIS (BLADE) ×1 IMPLANT
BLADE SURG 15 STRL SS (BLADE) ×2
BNDG COHESIVE 3X5 TAN STRL LF (GAUZE/BANDAGES/DRESSINGS) ×3 IMPLANT
BNDG ESMARK 4X9 LF (GAUZE/BANDAGES/DRESSINGS) IMPLANT
BNDG GAUZE ELAST 4 BULKY (GAUZE/BANDAGES/DRESSINGS) ×3 IMPLANT
CHLORAPREP W/TINT 26ML (MISCELLANEOUS) ×3 IMPLANT
CORD BIPOLAR FORCEPS 12FT (ELECTRODE) ×3 IMPLANT
COVER BACK TABLE 60X90IN (DRAPES) ×3 IMPLANT
COVER MAYO STAND STRL (DRAPES) ×3 IMPLANT
CUFF TOURNIQUET SINGLE 18IN (TOURNIQUET CUFF) ×3 IMPLANT
DRAPE EXTREMITY T 121X128X90 (DRAPE) ×3 IMPLANT
DRAPE SURG 17X23 STRL (DRAPES) ×3 IMPLANT
DRSG PAD ABDOMINAL 8X10 ST (GAUZE/BANDAGES/DRESSINGS) ×3 IMPLANT
GAUZE SPONGE 4X4 12PLY STRL (GAUZE/BANDAGES/DRESSINGS) ×3 IMPLANT
GAUZE XEROFORM 1X8 LF (GAUZE/BANDAGES/DRESSINGS) ×3 IMPLANT
GLOVE BIOGEL PI IND STRL 8.5 (GLOVE) ×1 IMPLANT
GLOVE BIOGEL PI INDICATOR 8.5 (GLOVE) ×2
GLOVE SURG ORTHO 8.0 STRL STRW (GLOVE) ×3 IMPLANT
GOWN STRL REUS W/ TWL LRG LVL3 (GOWN DISPOSABLE) ×1 IMPLANT
GOWN STRL REUS W/TWL LRG LVL3 (GOWN DISPOSABLE) ×3
GOWN STRL REUS W/TWL XL LVL3 (GOWN DISPOSABLE) ×3 IMPLANT
NEEDLE PRECISIONGLIDE 27X1.5 (NEEDLE) IMPLANT
NS IRRIG 1000ML POUR BTL (IV SOLUTION) ×3 IMPLANT
PACK BASIN DAY SURGERY FS (CUSTOM PROCEDURE TRAY) ×3 IMPLANT
STOCKINETTE 4X48 STRL (DRAPES) ×3 IMPLANT
SUT ETHILON 4 0 PS 2 18 (SUTURE) ×3 IMPLANT
SUT VICRYL 4-0 PS2 18IN ABS (SUTURE) IMPLANT
SYR BULB 3OZ (MISCELLANEOUS) ×3 IMPLANT
SYR CONTROL 10ML LL (SYRINGE) IMPLANT
TOWEL OR 17X24 6PK STRL BLUE (TOWEL DISPOSABLE) ×3 IMPLANT
UNDERPAD 30X30 (UNDERPADS AND DIAPERS) ×3 IMPLANT

## 2017-10-04 NOTE — Op Note (Signed)
Dictation Number [id number]

## 2017-10-04 NOTE — H&P (Signed)
Pamela [name] is an 57 y.o. female.   Chief Complaint: numbness right hand HPI: Pamela LankJacqueline is a 57 yo female with bilateral carpal tunnel syndromes.  This been going on for a considerable period of time. She had nerve conductions done 2 years ago. These were done by Dr. Anne HahnWillis and are reviewed. Unfortunately numbers were not given. She continues to complain of numbness tingling pain in her hands for the right greater than left. She states this is not significantly improved with time. She has no history of injury to her neck. She does have a burning type pain with a VAS score 3/10. It increases to 7-8/10 with use. She is works out Office managercutting leather. She has been taking anti-inflammatories wearing splints. She has had injections in the past. She has no history of diabetes thyroid problems. She does have history of arthritis. She has no history of gout. Family history is positive diabetes arthritis but negative for thyroid problems and gout. States use makes it worse rest makes it better but she has has constant discomfort. She was sent to Dr. Riccardo DubinKarvelas for repeat nerve conductions.Nerve conductions by Dr. Riccardo DubinKarvelas reveal bilateral carpal tunnel syndrome along with cubital tunnel left side. She shows a motor delay of 5.1 on the right 4.1 on the left sensory delay of 3.0 on the left 3.2 on the right. She shows enlargement of the ulnar nerve at her left elbow.          Past Medical History:  Diagnosis Date  . Anxiety    takes Effexor  . Arthritis    neck and lower back  . Carpal tunnel syndrome of right wrist 09/2017  . Degenerative disc disease, lumbar   . Depression   . Epilepsy (HCC)    last seizure 15 - 20 years  . Gastric ulcer   . GERD (gastroesophageal reflux disease)   . Heart murmur    no known problems, per pt. - no cardiologist  . History of hiatal hernia   . History of scoliosis   . History of substance abuse   . Pinched nerve    left elbow  . Seasonal allergies   . Wears  dentures    full    Past Surgical History:  Procedure Laterality Date  . ABDOMINAL HYSTERECTOMY     partial  . APPENDECTOMY  12/01/2008  . BILATERAL SALPINGOOPHORECTOMY  12/01/2008  . EYE SURGERY     tear duct surgery  . LUMBAR LAMINECTOMY/DECOMPRESSION MICRODISCECTOMY N/A 07/29/2014   Procedure: LUMBAR LAMINECTOMY/DECOMPRESSION MICRODISCECTOMY;  Surgeon: Emilee HeroMark Leonard Dumonski, MD;  [facility];  Service: Orthopedics;  Laterality: N/A;  Lumbar 4-5 decompression  . SHOULDER ARTHROSCOPY Right   . SPINAL FIXATION SURGERY     Harrington rods    Family History  Problem Relation Age of Onset  . Prostate cancer Father   . Diabetes Father   . Breast cancer Mother   . Prostate cancer Brother   . Hepatitis C Brother    Social History:  reports that she has been smoking cigarettes.  She has a 31.00 pack-year smoking history. she has never used smokeless tobacco. She reports that she does not drink alcohol or use drugs.  Allergies:  Allergies  Allergen Reactions  . Equate Nicotine [Nicotine] Shortness Of Breath    LETHARGY, TREMORS  . Celebrex [Celecoxib] Other (See Comments)    LETHARGY, TREMORS  . Lactose Intolerance (Gi) Other (See Comments)    GI UPSET  . Zoloft [Sertraline Hcl] Other (See Comments)  LETHARGY, TREMORS     No medications prior to admission.    No results found for this or any previous visit (from the past 48 hour(s)).  No results found.   Pertinent items are noted in HPI.  Height 5\' 7"  (1.702 m), weight 67.1 kg (148 lb).  General appearance: alert, cooperative and appears stated age Head: Normocephalic, without obvious abnormality Neck: no JVD Resp: clear to auscultation bilaterally Cardio: regular rate and rhythm, S1, S2 normal, no murmur, click, rub or gallop GI: soft, non-tender; bowel sounds normal; no masses,  no organomegaly Extremities: numbness right hand Pulses: 2+ and symmetric Skin: Skin color, texture, turgor normal. No rashes  or lesions Neurologic: Grossly normal Incision/Wound: na  Assessment/Plan  Assessment:  1. Bilateral carpal tunnel syndrome  2. Entrapment of left ulnar nerve    Plan: Nerve conductions are reviewed with her. She would like to proceed to have the right side operated on. We have offered a repeat injection she has declined. She is aware that there is no guarantee to the surgery the possibility of infection recurrence injury to arteries nerves tendons complete relief symptoms and dystrophy. She is scheduled for right carpal tunnel release in outpatient under regional anesthesia. She is advised that we will have her out of work until she feels she can return to do this.     Cedar Ditullio R 10/04/2017, 5:22 AM

## 2017-10-04 NOTE — Transfer of Care (Signed)
Immediate Anesthesia Transfer of Care Note  Patient: Pamela [name]  Procedure(s) Performed: RIGHT CARPAL TUNNEL RELEASE (Right Wrist)  Patient Location: PACU  Anesthesia Type:MAC and Bier block  Level of Consciousness: awake, alert  and oriented  Airway & Oxygen Therapy: Patient Spontanous Breathing and Patient connected to face mask oxygen  Post-op Assessment: Report given to RN and Post -op Vital signs reviewed and stable  Post vital signs: Reviewed and stable  Last Vitals:  Vitals:   10/04/17 1009  BP: 94/67  Pulse: (!) 45  Temp: 36.7 C  SpO2: 100%    Last Pain:  Vitals:   10/04/17 1009  TempSrc: Oral  PainSc: 5          Complications: No apparent anesthesia complications

## 2017-10-04 NOTE — Discharge Instructions (Addendum)
[facility] Instructions °Hand Surgery ° °Wound Care: °Keep your hand elevated above the level of your heart.  Do not allow it to dangle by your side.  Keep the dressing dry and do not remove it unless your doctor advises you to do so.  He will usually change it at the time of your post-op visit.  Moving your fingers is advised to stimulate circulation but will depend on the site of your surgery.  If you have a splint applied, your doctor will advise you regarding movement. ° °Activity: °Do not drive or operate machinery today.  Rest today and then you may return to your normal activity and work as indicated by your physician. ° °Diet:  °Drink liquids today or eat a light diet.  You may resume a regular diet tomorrow.   ° °General expectations: °Pain for two to three days. °Fingers may become slightly swollen. ° °Call your doctor if any of the following occur: °Severe pain not relieved by pain medication. °Elevated temperature. °Dressing soaked with blood. °Inability to move fingers. °White or bluish color to fingers. ° ° °Post Anesthesia Home Care Instructions ° °Activity: °Get plenty of rest for the remainder of the day. A responsible individual must stay with you for 24 hours following the procedure.  °For the next 24 hours, DO NOT: °-Drive a car °-Operate machinery °-Drink alcoholic beverages °-Take any medication unless instructed by your physician °-Make any legal decisions or sign important papers. ° °Meals: °Start with liquid foods such as gelatin or soup. Progress to regular foods as tolerated. Avoid greasy, spicy, heavy foods. If nausea and/or vomiting occur, drink only clear liquids until the nausea and/or vomiting subsides. Call your physician if vomiting continues. ° °Special Instructions/Symptoms: °Your throat may feel dry or sore from the anesthesia or the breathing tube placed in your throat during surgery. If this causes discomfort, gargle with warm salt water. The discomfort should disappear within  24 hours. ° °If you had a scopolamine patch placed behind your ear for the management of post- operative nausea and/or vomiting: ° °1. The medication in the patch is effective for 72 hours, after which it should be removed.  Wrap patch in a tissue and discard in the trash. Wash hands thoroughly with soap and water. °2. You may remove the patch earlier than 72 hours if you experience unpleasant side effects which may include dry mouth, dizziness or visual disturbances. °3. Avoid touching the patch. Wash your hands with soap and water after contact with the patch. °  ° °Post Anesthesia Home Care Instructions ° °Activity: °Get plenty of rest for the remainder of the day. A responsible individual must stay with you for 24 hours following the procedure.  °For the next 24 hours, DO NOT: °-Drive a car °-Operate machinery °-Drink alcoholic beverages °-Take any medication unless instructed by your physician °-Make any legal decisions or sign important papers. ° °Meals: °Start with liquid foods such as gelatin or soup. Progress to regular foods as tolerated. Avoid greasy, spicy, heavy foods. If nausea and/or vomiting occur, drink only clear liquids until the nausea and/or vomiting subsides. Call your physician if vomiting continues. ° °Special Instructions/Symptoms: °Your throat may feel dry or sore from the anesthesia or the breathing tube placed in your throat during surgery. If this causes discomfort, gargle with warm salt water. The discomfort should disappear within 24 hours. ° °If you had a scopolamine patch placed behind your ear for the management of post- operative nausea and/or vomiting: ° °  1. The medication in the patch is effective for 72 hours, after which it should be removed.  Wrap patch in a tissue and discard in the trash. Wash hands thoroughly with soap and water. °2. You may remove the patch earlier than 72 hours if you experience unpleasant side effects which may include dry mouth, dizziness or visual  disturbances. °3. Avoid touching the patch. Wash your hands with soap and water after contact with the patch. °  ° °

## 2017-10-04 NOTE — Anesthesia Preprocedure Evaluation (Signed)
Anesthesia Evaluation  Patient identified by MRN, date of birth, ID band Patient awake    Reviewed: Allergy & Precautions, NPO status , Patient's Chart, lab work & pertinent test results  History of Anesthesia Complications Negative for: history of anesthetic complications  Airway Mallampati: I  TM Distance: >3 FB Neck ROM: Full    Dental  (+) Lower Dentures, Upper Dentures   Pulmonary neg shortness of breath, neg sleep apnea, neg COPD, neg recent URI, Current Smoker,    breath sounds clear to auscultation       Cardiovascular negative cardio ROS   Rhythm:Regular     Neuro/Psych Seizures -, Well Controlled,  PSYCHIATRIC DISORDERS Anxiety Depression  Neuromuscular disease    GI/Hepatic Neg liver ROS, hiatal hernia, PUD, GERD  Controlled,  Endo/Other  negative endocrine ROS  Renal/GU negative Renal ROS     Musculoskeletal  (+) Arthritis ,   Abdominal   Peds  Hematology negative hematology ROS (+)   Anesthesia Other Findings   Reproductive/Obstetrics                             Anesthesia Physical Anesthesia Plan  ASA: II  Anesthesia Plan: MAC and Bier Block and Bier Block-LIDOCAINE ONLY   Post-op Pain Management:    Induction:   PONV Risk Score and Plan: 1 and Treatment may vary due to age or medical condition  Airway Management Planned: Nasal Cannula  Additional Equipment: None  Intra-op Plan:   Post-operative Plan:   Informed Consent: I have reviewed the patients History and Physical, chart, labs and discussed the procedure including the risks, benefits and alternatives for the proposed anesthesia with the patient or authorized representative who has indicated his/her understanding and acceptance.   Dental advisory given  Plan Discussed with: CRNA and Surgeon  Anesthesia Plan Comments:         Anesthesia Quick Evaluation

## 2017-10-04 NOTE — Anesthesia Postprocedure Evaluation (Signed)
Anesthesia Post Note  Patient: Pamela [name]  Procedure(s) Performed: RIGHT CARPAL TUNNEL RELEASE (Right Wrist)     Patient location during evaluation: PACU Anesthesia Type: MAC and Bier Block Level of consciousness: awake and alert Pain management: pain level controlled Vital Signs Assessment: post-procedure vital signs reviewed and stable Respiratory status: spontaneous breathing, nonlabored ventilation, respiratory function stable and patient connected to nasal cannula oxygen Cardiovascular status: stable and blood pressure returned to baseline Postop Assessment: no apparent nausea or vomiting Anesthetic complications: no    Last Vitals:  Vitals:   10/04/17 1115 10/04/17 1145  BP: 91/63 103/68  Pulse: (!) 44 (!) 57  Resp: 15 16  Temp:  36.6 C  SpO2: 100% 100%    Last Pain:  Vitals:   10/04/17 1145  TempSrc:   PainSc: 0-No pain                 Raquell Richer

## 2017-10-04 NOTE — Op Note (Signed)
NAME:  Pamela [name], Pamela [name] NO.:  [id number]  MEDICAL RECORD NO.:  [id number]  LOCATION:                                 FACILITY:  PHYSICIAN:  Cindee Salt, M.D.       DATE OF BIRTH:  1961-04-19  DATE OF PROCEDURE:  10/04/2017 DATE OF DISCHARGE:                              OPERATIVE REPORT   PREOPERATIVE DIAGNOSIS:  Carpal tunnel syndrome, right hand.  POSTOPERATIVE DIAGNOSIS:  Carpal tunnel syndrome, right hand.  OPERATION:  Decompression, right median nerve.  SURGEON:  Cindee Salt, MD.  ASSISTANT:  None.  ANESTHESIA:  Forearm-based IV regional with local infiltration, IV sedation.  PLACE OF SURGERY:  [facility].  ANESTHESIOLOGIST:  Dr. Maple Hudson.  HISTORY:  The patient is a 57 year old female with a history of bilateral carpal tunnel syndrome, nerve conduction is positive, which has not responded to conservative treatment.  She has elected to undergo surgical release of the median nerve, right hand.  Pre, peri, and postoperative course have been discussed along with risks and complications.  She is aware that there is no guarantee to the surgery; the possibility of infection; recurrence of injury to arteries, nerves, tendons; incomplete relief of symptoms; dystrophy.  In preoperative area, the patient is seen, the extremity marked by both the patient and surgeon.  Antibiotic given.  DESCRIPTION OF PROCEDURE:  The patient was brought to the operating room, where a forearm-based IV regional anesthetic was carried out without difficulty under the direction of anesthesia department.  She was prepped using ChloraPrep in supine position with the right arm free. A 3-minute dry time was allowed and time-out taken, confirming the patient and procedure.  A longitudinal incision was made in the right palm, carried down through subcutaneous tissue.  Bleeders were electrocauterized with bipolar.  The palmar fascia was split. Superficial palmar arch was  identified.  The flexor tendon to the ring and little finger was identified and retracted radially protecting the median nerve radially and ulnar nerve ulnarly.  The flexor retinaculum was then incised on its ulnar border.  A right angle and Sewell retractor were placed between skin and forearm fascia after dissecting this free bluntly.  Deep structures were dissected off from the distal forearm fascia.  This was then released for approximately 2-3 cm proximal to the wrist crease under direct vision.  The canal was explored.  A bifid nerve was present with a large persistent median artery with __________.  The area of compression was apparent.  No further lesions were identified.  The wound was copiously irrigated with saline.  Motor branch entered into muscle distally.  The wound was irrigated and closed with interrupted 4-0 nylon sutures.  A local infiltration with 0.25% bupivacaine without epinephrine was given. Approximately 8 mL was used.  A sterile compressive dressing with the fingers free was applied.  On deflation of the tourniquet, all fingers immediately pinked.  She was taken to the recovery room for observation in satisfactory condition.  She will be discharged home to return to the [facility] in 1 week, on [place].          ______________________________ Cindee Salt, M.D.     GK/MEDQ  D:  10/04/2017  T:  10/04/2017  Job:  [id number]

## 2017-10-04 NOTE — Brief Op Note (Signed)
10/04/2017  10:59 AM  PATIENT:  Charlton AmorJacqueline D Wertenberger  57 y.o. female  PRE-OPERATIVE DIAGNOSIS:  RIGHT CARPAL TUNNEL SYNDROME  POST-OPERATIVE DIAGNOSIS:  RIGHT CARPAL TUNNEL SYNDROME  PROCEDURE:  Procedure(s): RIGHT CARPAL TUNNEL RELEASE (Right)  SURGEON:  Surgeon(s) and Role:    * Cindee SaltKuzma, Aneudy Champlain, MD - Primary  PHYSICIAN ASSISTANT:   ASSISTANTS: none   ANESTHESIA:   local, regional and IV sedation  EBL:  3 mL   BLOOD ADMINISTERED:none  DRAINS: none   LOCAL MEDICATIONS USED:  BUPIVICAINE   SPECIMEN:  No Specimen  DISPOSITION OF SPECIMEN:  N/A  COUNTS:  YES  TOURNIQUET:   Total Tourniquet Time Documented: Forearm (Right) - 17 minutes Total: Forearm (Right) - 17 minutes   DICTATION: .Other Dictation: Dictation Number [id number]  PLAN OF CARE: Discharge to home after PACU  PATIENT DISPOSITION:  PACU - hemodynamically stable.

## 2017-10-08 ENCOUNTER — Encounter (HOSPITAL_BASED_OUTPATIENT_CLINIC_OR_DEPARTMENT_OTHER): Payer: Self-pay | Admitting: Orthopedic Surgery

## 2017-11-05 ENCOUNTER — Telehealth: Payer: Self-pay | Admitting: *Deleted

## 2017-11-05 NOTE — Telephone Encounter (Signed)
Called, LVM for pt letting her know I was calling to offer appt with CW,MD tomorrow at 730am since she was on wait list. Advised since unable to reach I will go to next person. I will call back if any other appt become available.

## 2017-11-14 ENCOUNTER — Encounter (HOSPITAL_BASED_OUTPATIENT_CLINIC_OR_DEPARTMENT_OTHER): Payer: Self-pay | Admitting: *Deleted

## 2017-11-14 ENCOUNTER — Other Ambulatory Visit: Payer: Self-pay | Admitting: Orthopedic Surgery

## 2017-11-14 ENCOUNTER — Other Ambulatory Visit: Payer: Self-pay

## 2017-11-20 ENCOUNTER — Ambulatory Visit (HOSPITAL_BASED_OUTPATIENT_CLINIC_OR_DEPARTMENT_OTHER)
Admission: RE | Admit: 2017-11-20 | Discharge: 2017-11-20 | Disposition: A | Discharge status: Home / Self Care | Payer: Managed Care, Other (non HMO) | Source: Ambulatory Visit | Attending: Orthopedic Surgery | Admitting: Orthopedic Surgery

## 2017-11-20 ENCOUNTER — Ambulatory Visit (HOSPITAL_BASED_OUTPATIENT_CLINIC_OR_DEPARTMENT_OTHER): Payer: Managed Care, Other (non HMO) | Admitting: Certified Registered"

## 2017-11-20 ENCOUNTER — Encounter (HOSPITAL_BASED_OUTPATIENT_CLINIC_OR_DEPARTMENT_OTHER): Payer: Self-pay

## 2017-11-20 ENCOUNTER — Encounter (HOSPITAL_BASED_OUTPATIENT_CLINIC_OR_DEPARTMENT_OTHER)
Admission: RE | Disposition: A | Discharge status: Home / Self Care | Payer: Self-pay | Source: Ambulatory Visit | Attending: Orthopedic Surgery

## 2017-11-20 DIAGNOSIS — Z888 Allergy status to other drugs, medicaments and biological substances status: Secondary | ICD-10-CM | POA: Insufficient documentation

## 2017-11-20 DIAGNOSIS — E739 Lactose intolerance, unspecified: Secondary | ICD-10-CM | POA: Diagnosis not present

## 2017-11-20 DIAGNOSIS — Z886 Allergy status to analgesic agent status: Secondary | ICD-10-CM | POA: Diagnosis not present

## 2017-11-20 DIAGNOSIS — G5602 Carpal tunnel syndrome, left upper limb: Secondary | ICD-10-CM | POA: Diagnosis present

## 2017-11-20 DIAGNOSIS — F17210 Nicotine dependence, cigarettes, uncomplicated: Secondary | ICD-10-CM | POA: Insufficient documentation

## 2017-11-20 DIAGNOSIS — F419 Anxiety disorder, unspecified: Secondary | ICD-10-CM | POA: Insufficient documentation

## 2017-11-20 DIAGNOSIS — M479 Spondylosis, unspecified: Secondary | ICD-10-CM | POA: Insufficient documentation

## 2017-11-20 DIAGNOSIS — K219 Gastro-esophageal reflux disease without esophagitis: Secondary | ICD-10-CM | POA: Insufficient documentation

## 2017-11-20 DIAGNOSIS — Z8669 Personal history of other diseases of the nervous system and sense organs: Secondary | ICD-10-CM | POA: Insufficient documentation

## 2017-11-20 DIAGNOSIS — Z79899 Other long term (current) drug therapy: Secondary | ICD-10-CM | POA: Insufficient documentation

## 2017-11-20 DIAGNOSIS — F329 Major depressive disorder, single episode, unspecified: Secondary | ICD-10-CM | POA: Diagnosis not present

## 2017-11-20 HISTORY — PX: CARPAL TUNNEL RELEASE: SHX101

## 2017-11-20 SURGERY — CARPAL TUNNEL RELEASE
Anesthesia: Monitor Anesthesia Care | Site: Wrist | Laterality: Left

## 2017-11-20 MED ORDER — ONDANSETRON HCL 4 MG/2ML IJ SOLN
INTRAMUSCULAR | Status: DC | PRN
  Administered 2017-11-20: 4 mg via INTRAVENOUS

## 2017-11-20 MED ORDER — MIDAZOLAM HCL 2 MG/2ML IJ SOLN
1.0000 mg | INTRAMUSCULAR | Status: DC | PRN
  Administered 2017-11-20: 2 mg via INTRAVENOUS

## 2017-11-20 MED ORDER — OXYCODONE HCL 5 MG/5ML PO SOLN: 5.0000 mg | Freq: Once | ORAL | Status: DC | PRN

## 2017-11-20 MED ORDER — PROPOFOL 500 MG/50ML IV EMUL
INTRAVENOUS | Status: DC | PRN
  Administered 2017-11-20: 50 ug/kg/min via INTRAVENOUS

## 2017-11-20 MED ORDER — TRAMADOL HCL 50 MG PO TABS: 50.0000 mg | ORAL_TABLET | Freq: Four times a day (QID) | ORAL | 0 refills | Status: DC | PRN

## 2017-11-20 MED ORDER — LIDOCAINE HCL (PF) 0.5 % IJ SOLN
INTRAMUSCULAR | Status: DC | PRN
  Administered 2017-11-20: 30 mL via INTRAVENOUS

## 2017-11-20 MED ORDER — BUPIVACAINE HCL (PF) 0.25 % IJ SOLN
INTRAMUSCULAR | Status: AC
  Filled 2017-11-20: qty 30

## 2017-11-20 MED ORDER — FENTANYL CITRATE (PF) 100 MCG/2ML IJ SOLN: 25.0000 ug | INTRAMUSCULAR | Status: DC | PRN

## 2017-11-20 MED ORDER — HYDROCODONE-ACETAMINOPHEN 7.5-325 MG PO TABS: 1.0000 | ORAL_TABLET | Freq: Once | ORAL | Status: DC | PRN

## 2017-11-20 MED ORDER — PROMETHAZINE HCL 25 MG/ML IJ SOLN: 6.2500 mg | INTRAMUSCULAR | Status: DC | PRN

## 2017-11-20 MED ORDER — CHLORHEXIDINE GLUCONATE 4 % EX LIQD: 60.0000 mL | Freq: Once | CUTANEOUS | Status: DC

## 2017-11-20 MED ORDER — FENTANYL CITRATE (PF) 100 MCG/2ML IJ SOLN
50.0000 ug | INTRAMUSCULAR | Status: DC | PRN
  Administered 2017-11-20: 50 ug via INTRAVENOUS

## 2017-11-20 MED ORDER — LACTATED RINGERS IV SOLN
INTRAVENOUS | Status: DC
  Administered 2017-11-20: 09:00:00 via INTRAVENOUS

## 2017-11-20 MED ORDER — CEFAZOLIN SODIUM-DEXTROSE 2-4 GM/100ML-% IV SOLN
2.0000 g | INTRAVENOUS | Status: AC
  Administered 2017-11-20: 2 g via INTRAVENOUS

## 2017-11-20 MED ORDER — CEFAZOLIN SODIUM-DEXTROSE 2-4 GM/100ML-% IV SOLN
INTRAVENOUS | Status: AC
Start: 2017-11-20 — End: ?
  Filled 2017-11-20: qty 100

## 2017-11-20 MED ORDER — OXYCODONE HCL 5 MG PO TABS: 5.0000 mg | ORAL_TABLET | Freq: Once | ORAL | Status: DC | PRN

## 2017-11-20 MED ORDER — FENTANYL CITRATE (PF) 100 MCG/2ML IJ SOLN
INTRAMUSCULAR | Status: AC
  Filled 2017-11-20: qty 2

## 2017-11-20 MED ORDER — ACETAMINOPHEN 325 MG PO TABS: 325.0000 mg | ORAL_TABLET | ORAL | Status: DC | PRN

## 2017-11-20 MED ORDER — BUPIVACAINE HCL (PF) 0.25 % IJ SOLN
INTRAMUSCULAR | Status: DC | PRN
  Administered 2017-11-20: 10 mL

## 2017-11-20 MED ORDER — CLINDAMYCIN PHOSPHATE 600 MG/50ML IV SOLN
INTRAVENOUS | Status: AC
  Filled 2017-11-20: qty 50

## 2017-11-20 MED ORDER — SCOPOLAMINE 1 MG/3DAYS TD PT72: 1.0000 | MEDICATED_PATCH | Freq: Once | TRANSDERMAL | Status: DC | PRN

## 2017-11-20 MED ORDER — MEPERIDINE HCL 25 MG/ML IJ SOLN: 6.2500 mg | INTRAMUSCULAR | Status: DC | PRN

## 2017-11-20 MED ORDER — ACETAMINOPHEN 160 MG/5ML PO SOLN: 325.0000 mg | ORAL | Status: DC | PRN

## 2017-11-20 MED ORDER — MIDAZOLAM HCL 2 MG/2ML IJ SOLN
INTRAMUSCULAR | Status: AC
  Filled 2017-11-20: qty 2

## 2017-11-20 SURGICAL SUPPLY — 34 items
BLADE SURG 15 STRL LF DISP TIS (BLADE) ×1 IMPLANT
BLADE SURG 15 STRL SS (BLADE) ×3
BNDG CMPR 9X4 STRL LF SNTH (GAUZE/BANDAGES/DRESSINGS)
BNDG COHESIVE 3X5 TAN STRL LF (GAUZE/BANDAGES/DRESSINGS) ×3 IMPLANT
BNDG ESMARK 4X9 LF (GAUZE/BANDAGES/DRESSINGS) IMPLANT
BNDG GAUZE ELAST 4 BULKY (GAUZE/BANDAGES/DRESSINGS) ×3 IMPLANT
CHLORAPREP W/TINT 26ML (MISCELLANEOUS) ×3 IMPLANT
CORD BIPOLAR FORCEPS 12FT (ELECTRODE) ×3 IMPLANT
COVER BACK TABLE 60X90IN (DRAPES) ×3 IMPLANT
COVER MAYO STAND STRL (DRAPES) ×3 IMPLANT
CUFF TOURNIQUET SINGLE 18IN (TOURNIQUET CUFF) ×3 IMPLANT
DRAPE EXTREMITY T 121X128X90 (DRAPE) ×3 IMPLANT
DRAPE SURG 17X23 STRL (DRAPES) ×3 IMPLANT
DRSG PAD ABDOMINAL 8X10 ST (GAUZE/BANDAGES/DRESSINGS) ×3 IMPLANT
GAUZE SPONGE 4X4 12PLY STRL (GAUZE/BANDAGES/DRESSINGS) ×3 IMPLANT
GAUZE XEROFORM 1X8 LF (GAUZE/BANDAGES/DRESSINGS) ×3 IMPLANT
GLOVE BIO SURGEON STRL SZ 6.5 (GLOVE) ×6 IMPLANT
GLOVE BIO SURGEONS STRL SZ 6.5 (GLOVE) ×3
GLOVE BIOGEL PI IND STRL 8.5 (GLOVE) ×1 IMPLANT
GLOVE BIOGEL PI INDICATOR 8.5 (GLOVE) ×2
GLOVE SURG ORTHO 8.0 STRL STRW (GLOVE) ×3 IMPLANT
GOWN STRL REUS W/ TWL LRG LVL3 (GOWN DISPOSABLE) ×1 IMPLANT
GOWN STRL REUS W/TWL LRG LVL3 (GOWN DISPOSABLE) ×3
GOWN STRL REUS W/TWL XL LVL3 (GOWN DISPOSABLE) ×3 IMPLANT
NEEDLE PRECISIONGLIDE 27X1.5 (NEEDLE) ×3 IMPLANT
NS IRRIG 1000ML POUR BTL (IV SOLUTION) ×3 IMPLANT
PACK BASIN DAY SURGERY FS (CUSTOM PROCEDURE TRAY) ×3 IMPLANT
STOCKINETTE 4X48 STRL (DRAPES) ×3 IMPLANT
SUT ETHILON 4 0 PS 2 18 (SUTURE) ×3 IMPLANT
SUT VICRYL 4-0 PS2 18IN ABS (SUTURE) IMPLANT
SYR BULB 3OZ (MISCELLANEOUS) ×3 IMPLANT
SYR CONTROL 10ML LL (SYRINGE) ×3 IMPLANT
TOWEL OR 17X24 6PK STRL BLUE (TOWEL DISPOSABLE) ×3 IMPLANT
UNDERPAD 30X30 (UNDERPADS AND DIAPERS) ×3 IMPLANT

## 2017-11-20 NOTE — Transfer of Care (Signed)
Immediate Anesthesia Transfer of Care Note  Patient: Pamela [name]  Procedure(s) Performed: LEFT CARPAL TUNNEL RELEASE (Left Wrist)  Patient Location: PACU  Anesthesia Type:MAC combined with regional for post-op pain  Level of Consciousness: awake, alert , oriented and patient cooperative  Airway & Oxygen Therapy: Patient Spontanous Breathing and Patient connected to face mask oxygen  Post-op Assessment: Report given to RN and Post -op Vital signs reviewed and stable  Post vital signs: Reviewed and stable  Last Vitals:  Vitals:   11/20/17 0913  BP: 96/61  Pulse: (!) 46  Resp: 18  Temp: 36.4 C  SpO2: 100%    Last Pain:  Vitals:   11/20/17 0913  TempSrc: Oral         Complications: No apparent anesthesia complications

## 2017-11-20 NOTE — Discharge Instructions (Addendum)

## 2017-11-20 NOTE — Anesthesia Procedure Notes (Signed)
Anesthesia Regional Block: Bier block (IV Regional)   Pre-Anesthetic Checklist: ,, timeout performed, Correct Patient, Correct Site, Correct Laterality, Correct Procedure,, site marked, surgical consent,, at surgeon's request  Laterality: Left     Needles:  Injection technique: Single-shot  Needle Type: Other      Needle Gauge: 22     Additional Needles:   Procedures:,,,,, intact distal pulses, Esmarch exsanguination, single tourniquet utilized,  Narrative:   Performed by: Personally       

## 2017-11-20 NOTE — Op Note (Signed)
NAME:  Pamela [name], Pamela [name]         ACCOUNT NO.:  [id number]  MEDICAL RECORD NO.:  [id number]  LOCATION:                                 FACILITY:  PHYSICIAN:  Cindee SaltGary Gerrad Welker, M.D.       DATE OF BIRTH:  July 27, 1961  DATE OF PROCEDURE:  11/20/2017 DATE OF DISCHARGE:                              OPERATIVE REPORT   PREOPERATIVE DIAGNOSIS:  Carpal tunnel syndrome, left hand.  POSTOPERATIVE DIAGNOSIS:  Carpal tunnel syndrome, left hand.  OPERATION:  Decompression of left median nerve.  SURGEON:  Cindee SaltGary Tabia Landowski, MD.  ASSISTANT:  None.  ANESTHESIA:  Forearm IV regional with local infiltration.  [facility]  ANESTHESIOLOGIST:  [place].  HISTORY:  The patient is a 57 year old female with a history of carpal tunnel syndrome.  Nerve conduction is positive.  She has undergone release on the right side with good result and has elected to undergo release on her left side.  Pre, peri, and postoperative courses have been discussed along with risks and complications.  She is aware that there is no guarantee to the surgery, the possibility of infection, recurrence of injury to arteries, nerves, and tendons, incomplete relief of symptoms, and dystrophy.  In the preoperative area, the patient is seen, the extremity marked by both patient and surgeon, antibiotic given.  DESCRIPTION OF PROCEDURE:  The patient was brought to the operating room where a forearm-based IV regional anesthetic was carried out without difficulty.  She was prepped using ChloraPrep in supine position with the left arm free.  A 3-minute dry time was allowed and a time-out was taken confirming the patient and procedure.  A longitudinal incision was made in the left palm and carried down through subcutaneous tissue. Bleeders were electrocauterized with bipolar.  The palmar fascia was split.  The superficial palmar arch was identified.  The flexor tendon to the ring and little finger was identified.   To the ulnar side of median nerve, the carpal retinaculum was incised with sharp dissection after placing retractors retracting the median nerve radially and the ulnar nerve ulnarly.  A right angle and Sewell retractor were placed between skin and forearm fascia and the fascia was released for approximately 2-3 cm proximal to the wrist crease under direct vision. The canal was explored.  An area of compression to the nerve was apparent.  Motor branch entered into muscle distally.  The wound was copiously irrigated with saline.  The skin was then closed with interrupted 4-0 nylon sutures.  A local infiltration with 0.25% bupivacaine without epinephrine was given, approximately 9 mL was used. A sterile compressive dressing with the fingers free was applied.  On deflation of the tourniquet, all fingers immediately pinked.  She was taken to the recovery room for observation in satisfactory condition.  She will be discharged to home, [facility] [place] [facility], on tramadol.          ______________________________ Cindee SaltGary Naya Ilagan, M.D.     GK/MEDQ  D:  11/20/2017  T:  11/20/2017  Job:  [id number]

## 2017-11-20 NOTE — Op Note (Signed)
[id number] 

## 2017-11-20 NOTE — Anesthesia Procedure Notes (Signed)
Procedure Name: MAC Date/Time: 11/20/2017 9:45 AM Performed by: Signe Colt, CRNA Pre-anesthesia Checklist: Patient identified, Emergency Drugs available, Suction available, Patient being monitored and Timeout performed Patient Re-evaluated:Patient Re-evaluated prior to induction Oxygen Delivery Method: Simple face mask

## 2017-11-20 NOTE — Brief Op Note (Signed)
11/20/2017  10:04 AM  PATIENT:  Pamela [name]  57 y.o. female  PRE-OPERATIVE DIAGNOSIS:  LEFT CARPAL TUNNEL SYNDROME  POST-OPERATIVE DIAGNOSIS:  LEFT CARPAL TUNNEL SYNDROME  PROCEDURE:  Procedure(s): LEFT CARPAL TUNNEL RELEASE (Left)  SURGEON:  Surgeon(s) and Role:    * Cindee SaltKuzma, Nickolette Espinola, MD - Primary  PHYSICIAN ASSISTANT:   ASSISTANTS: none   ANESTHESIA:   local and regional  EBL: 1ml  BLOOD ADMINISTERED:none  DRAINS: none   LOCAL MEDICATIONS USED:  BUPIVICAINE   SPECIMEN:  No Specimen  DISPOSITION OF SPECIMEN:  N/A  COUNTS:  YES  TOURNIQUET:   Total Tourniquet Time Documented: Forearm (Left) - 20 minutes Total: Forearm (Left) - 20 minutes   DICTATION: .Other Dictation: Dictation Number [id number]  PLAN OF CARE: Discharge to home after PACU  PATIENT DISPOSITION:  PACU - hemodynamically stable.

## 2017-11-20 NOTE — Anesthesia Postprocedure Evaluation (Signed)
Anesthesia Post Note  Patient: Pamela [name]  Procedure(s) Performed: LEFT CARPAL TUNNEL RELEASE (Left Wrist)     Patient location during evaluation: PACU Anesthesia Type: MAC Level of consciousness: awake and alert Pain management: pain level controlled Vital Signs Assessment: post-procedure vital signs reviewed and stable Respiratory status: spontaneous breathing Cardiovascular status: stable Anesthetic complications: no    Last Vitals:  Vitals:   11/20/17 1030 11/20/17 1106  BP: 105/72 118/71  Pulse: (!) 50 (!) 51  Resp: 18 20  Temp:  36.6 C  SpO2: 100% 99%    Last Pain:  Vitals:   11/20/17 1106  TempSrc: Oral                 Nolon Nations

## 2017-11-20 NOTE — Anesthesia Preprocedure Evaluation (Signed)
Anesthesia Evaluation  Patient identified by MRN, date of birth, ID band Patient awake    Reviewed: Allergy & Precautions, NPO status , Patient's Chart, lab work & pertinent test results  History of Anesthesia Complications Negative for: history of anesthetic complications  Airway Mallampati: I  TM Distance: >3 FB Neck ROM: Full    Dental  (+) Lower Dentures, Upper Dentures   Pulmonary neg shortness of breath, neg sleep apnea, neg COPD, neg recent URI, Current Smoker,    breath sounds clear to auscultation       Cardiovascular negative cardio ROS   Rhythm:Regular     Neuro/Psych Seizures -, Well Controlled,  PSYCHIATRIC DISORDERS Anxiety Depression  Neuromuscular disease    GI/Hepatic Neg liver ROS, hiatal hernia, PUD, GERD  Controlled,  Endo/Other  negative endocrine ROS  Renal/GU negative Renal ROS     Musculoskeletal  (+) Arthritis ,   Abdominal   Peds  Hematology negative hematology ROS (+)   Anesthesia Other Findings   Reproductive/Obstetrics                             Anesthesia Physical  Anesthesia Plan  ASA: II  Anesthesia Plan: MAC and Bier Block and Bier Block-LIDOCAINE ONLY   Post-op Pain Management:    Induction:   PONV Risk Score and Plan: 1 and Treatment may vary due to age or medical condition  Airway Management Planned: Nasal Cannula  Additional Equipment: None  Intra-op Plan:   Post-operative Plan:   Informed Consent: I have reviewed the patients History and Physical, chart, labs and discussed the procedure including the risks, benefits and alternatives for the proposed anesthesia with the patient or authorized representative who has indicated his/her understanding and acceptance.   Dental advisory given  Plan Discussed with: CRNA and Surgeon  Anesthesia Plan Comments:         Anesthesia Quick Evaluation
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
## 2017-11-21 ENCOUNTER — Encounter (HOSPITAL_BASED_OUTPATIENT_CLINIC_OR_DEPARTMENT_OTHER): Payer: Self-pay | Admitting: Orthopedic Surgery

## 2017-11-27 ENCOUNTER — Ambulatory Visit: Payer: Managed Care, Other (non HMO) | Admitting: Neurology

## 2017-11-27 ENCOUNTER — Encounter: Payer: Self-pay | Admitting: Neurology

## 2017-11-27 VITALS — BP 100/60 | HR 60 | Ht 67.0 in | Wt 150.0 lb

## 2017-11-27 DIAGNOSIS — G40409 Other generalized epilepsy and epileptic syndromes, not intractable, without status epilepticus: Secondary | ICD-10-CM

## 2017-11-27 MED ORDER — GABAPENTIN 300 MG PO CAPS: 300.0000 mg | ORAL_CAPSULE | Freq: Two times a day (BID) | ORAL | 3 refills | Status: DC

## 2017-11-27 NOTE — Progress Notes (Signed)
Reason for visit: Seizures  Pamela [name] is an 57 y.o. female  History of present illness:  Pamela [name] is a 57 year old right-handed black female with a history of seizures that have been under good control with carbamazepine.  The patient indicates that she just recently had surgery on her right hand for carpal tunnel and then again on the left side for carpal tunnel on 20 November 2017.  She is healing up from this.  Within the last 1 month she has developed some pain from the left lower back down the leg to the foot with tingling in the left foot.  The patient has some discomfort on the right side as well but it is much more severe on the left.  In the past, she has had pain that was worse on the right leg.  She feels as if the legs may collapse at times, the pain is somewhat worse when she is up walking.  The patient is taking Ultram for pain from the left carpal tunnel syndrome surgery.  Previously, she was on Aleve for discomfort.  The patient returns the office today as a work in because of the back and left leg discomfort.  Past Medical History:  Diagnosis Date  . Anxiety    takes Effexor  . Arthritis    neck and lower back  . Carpal tunnel syndrome of right wrist 09/2017  . Degenerative disc disease, lumbar   . Depression   . Epilepsy (HCC)    last seizure 15 - 20 years  . Gastric ulcer   . GERD (gastroesophageal reflux disease)   . Heart murmur    no known problems, per pt. - no cardiologist  . History of hiatal hernia   . History of scoliosis   . History of substance abuse   . Pinched nerve    left elbow  . Seasonal allergies   . Wears dentures    full    Past Surgical History:  Procedure Laterality Date  . ABDOMINAL HYSTERECTOMY     partial  . APPENDECTOMY  12/01/2008  . BILATERAL SALPINGOOPHORECTOMY  12/01/2008  . CARPAL TUNNEL RELEASE Right 10/04/2017   Procedure: RIGHT CARPAL TUNNEL RELEASE;  Surgeon: Cindee SaltKuzma, Gary, MD;  Location: [facility];  Service: Orthopedics;  Laterality: Right;  . CARPAL TUNNEL RELEASE Left 11/20/2017   Procedure: LEFT CARPAL TUNNEL RELEASE;  Surgeon: Cindee SaltKuzma, Gary, MD;  Location: [facility];  Service: Orthopedics;  Laterality: Left;  . EYE SURGERY     tear duct surgery  . LUMBAR LAMINECTOMY/DECOMPRESSION MICRODISCECTOMY N/A 07/29/2014   Procedure: LUMBAR LAMINECTOMY/DECOMPRESSION MICRODISCECTOMY;  Surgeon: Emilee HeroMark Leonard Dumonski, MD;  [facility];  Service: Orthopedics;  Laterality: N/A;  Lumbar 4-5 decompression  . SHOULDER ARTHROSCOPY Right   . SPINAL FIXATION SURGERY     Harrington rods    Family History  Problem Relation Age of Onset  . Prostate cancer Father   . Diabetes Father   . Breast cancer Mother   . Prostate cancer Brother   . Hepatitis C Brother     Social history:  reports that she has been smoking cigarettes.  She has a 31.00 pack-year smoking history. She has never used smokeless tobacco. She reports that she does not drink alcohol or use drugs.    Allergies  Allergen Reactions  . Equate Nicotine [Nicotine] Shortness Of Breath    LETHARGY, TREMORS  . Celebrex [Celecoxib] Other (See Comments)    LETHARGY, TREMORS  .  Lactose Intolerance (Gi) Other (See Comments)    GI UPSET  . Zoloft [Sertraline Hcl] Other (See Comments)    LETHARGY, TREMORS     Medications:  Prior to Admission medications   Medication Sig Start Date End Date Taking? Authorizing Provider  carbamazepine (TEGRETOL XR) 200 MG 12 hr tablet Take 200 mg by mouth 2 (two) times daily.   Yes [provider]  Multiple Vitamin (MULTIVITAMIN) tablet Take 1 tablet by mouth daily.   Yes [provider]  naproxen sodium (ALEVE) 220 MG tablet Take 220 mg by mouth 2 (two) times daily as needed.   Yes [provider]  Probiotic Product (PROBIOTIC DAILY PO) Take 1 capsule by mouth daily.   Yes [provider]  TRAMADOL HCL PO Take 1 Dose by mouth 3 (three) times  daily.   Yes [provider]  venlafaxine XR (EFFEXOR XR) 150 MG 24 hr capsule Take 1 capsule (150 mg total) by mouth daily with breakfast. 10/09/16  Yes York Spaniel, MD  gabapentin (NEURONTIN) 300 MG capsule Take 1 capsule (300 mg total) by mouth 2 (two) times daily. 11/27/17   York Spaniel, MD    ROS:  Out of a complete 14 system review of symptoms, the patient complains only of the following symptoms, and all other reviewed systems are negative.  Eye itching, eye redness Cough Environmental allergies Joint pain, walking difficulty Snoring Numbness, tremors Anxiety  Blood pressure 100/60, pulse 60, height 5\' 7"  (1.702 m), weight 150 lb (68 kg).  Physical Exam  General: The patient is alert and cooperative at the time of the examination.  Neuromuscular: Range of movement the low back is full.  Some tenderness over the left SI joint is noted.  Skin: No significant peripheral edema is noted.   Neurologic Exam  Mental status: The patient is alert and oriented x 3 at the time of the examination. The patient has apparent normal recent and remote memory, with an apparently normal attention span and concentration ability.   Cranial nerves: Facial symmetry is present. Speech is normal, no aphasia or dysarthria is noted. Extraocular movements are full. Visual fields are full.  Motor: The patient has good strength in all 4 extremities.  The patient is able to walk on heels and the toes bilaterally.  Sensory examination: Soft touch sensation is symmetric on the face, arms, and legs.  Coordination: The patient has good finger-nose-finger and heel-to-shin bilaterally.  Gait and station: The patient has a limping type gait on the left leg.  The patient is able to walk independently.  Romberg is negative. No drift is seen.  Reflexes: Deep tendon reflexes are symmetric.   Assessment/Plan:  1.  History of seizures, well controlled  2.  Low back pain, left leg  pain  The patient will be placed on gabapentin taking 300 mg twice daily, she will call for any dose adjustments.  She may go back on Aleve taking 2 tablets twice daily.  She will call me if she is not doing well over the next 3-4 weeks, we may consider MRI evaluation of the low back.  She will follow-up in 3 months.  Marlan Palau MD 11/27/2017 4:27 PM  [facility] [name] [place], [place] [address]  Phone [phone] Fax [phone]

## 2017-11-27 NOTE — Patient Instructions (Signed)
   We will start gabapentin 300 mg twice a day for the back and the leg pain.  Neurontin (gabapentin) may result in drowsiness, ankle swelling, gait instability, or possibly dizziness. Please contact our office if significant side effects occur with this medication.

## 2017-12-03 ENCOUNTER — Telehealth: Payer: Self-pay | Admitting: Neurology

## 2017-12-03 NOTE — Telephone Encounter (Signed)
Pt calling re: the gabapentin (NEURONTIN) 300 MG capsule. Pt states that she still has the pain in her legs and the dosage of the gabapentin (NEURONTIN) 300 MG capsule is too high.  Pt said its just causes her to sleep heavily.  Please call

## 2017-12-03 NOTE — Telephone Encounter (Signed)
I called the patient.  Attention is not well tolerated due to drowsiness.  The patient is already on Effexor.  If desired, I will be happy to try low-dose Lyrica 25 mg dose to see if she can tolerate this and get some improvement in the pain.  She will let me know about this.

## 2017-12-04 MED ORDER — PREGABALIN 25 MG PO CAPS: 25.0000 mg | ORAL_CAPSULE | Freq: Two times a day (BID) | ORAL | 1 refills | Status: DC

## 2017-12-04 NOTE — Telephone Encounter (Signed)
I will call in a prescription for low-dose Lyrica 25 mg twice daily.

## 2017-12-04 NOTE — Addendum Note (Signed)
Addended by: York SpanielWILLIS, Irfan Veal K on: 12/04/2017 12:08 PM   Modules accepted: Orders

## 2017-12-04 NOTE — Telephone Encounter (Signed)
Pt states she will be happy to try Lyrica if it helps with the pain stating the only s/e from lyrica is constipation but will pair with an over the counter stool softener.

## 2017-12-26 ENCOUNTER — Ambulatory Visit: Payer: Managed Care, Other (non HMO) | Admitting: Adult Health

## 2017-12-27 ENCOUNTER — Other Ambulatory Visit: Payer: Self-pay | Admitting: Neurology

## 2018-01-07 ENCOUNTER — Telehealth: Payer: Self-pay | Admitting: Neurology

## 2018-01-07 NOTE — Telephone Encounter (Signed)
Pt ha proved her new insurance information and is now stating that an approval is needed for the pregabalin (LYRICA) 25 MG capsule.  Pt would like the refill to go to  [id number] [place], [place] - [address] [phone] (Phone) [phone] (Fax)     Pt states that Dr Anne Hahn has to contact her insurance company for an approval of the pregabalin (LYRICA) 25 MG capsule.   [phone] This is [employer]

## 2018-01-08 ENCOUNTER — Telehealth: Payer: Self-pay | Admitting: *Deleted

## 2018-01-08 MED ORDER — PREGABALIN 50 MG PO CAPS: 50.0000 mg | ORAL_CAPSULE | Freq: Two times a day (BID) | ORAL | 2 refills | Status: DC

## 2018-01-08 NOTE — Telephone Encounter (Signed)
Clarified with [facility].RN-PA approved for Lyrica  twice daily.

## 2018-01-08 NOTE — Addendum Note (Signed)
Addended by: York Spaniel on: 01/08/2018 08:57 AM   Modules accepted: Orders

## 2018-01-08 NOTE — Telephone Encounter (Signed)
Inititated Lyrica PA on CMM.  Per CMM, [employer] approved Lyrica for coverage up to the plan's supply limit  until 01/09/2019.

## 2018-01-08 NOTE — Telephone Encounter (Signed)
Pt called requesting a higher dosing for her Lyrica stating she is in a lot of pain and feels once a higher dose is in her system it will be better. Please call to advise

## 2018-01-08 NOTE — Telephone Encounter (Signed)
I called the patient.  The patient is tolerating the Lyrica dose at 25 mg but is still having a lot of pain, we will double the dose to 50 mg twice daily.  I will send a prescription for this.

## 2018-01-15 NOTE — Telephone Encounter (Signed)
I called pharmacy who stated rx will cost patient $286.41 for one month supply.   I called [facility] and spoke with pharmacy management. They stated when they run claim they see it will have a $35 copay. They recommended I call pharmacy back to clarify. If they still are having problems they can call pharmacy help desk at [phone].  I called pharmacy back. They did not have pt [employer] on file. I gave them ID/[email or web address] on card. They re-ran claim and it now shows $4.00 copay. They will get ready for pt. I called pt and LVM (ok per DPR) and relayed above information. Asked her to call back if she has further questions/concerns.

## 2018-01-15 NOTE — Telephone Encounter (Signed)
Patient says she cannot afford pregabalin (LYRICA) 50 MG capsule and needs something else that is cheaper called in for the pain in her legs. [facility] [address]. Please call and advise.

## 2018-02-12 ENCOUNTER — Telehealth: Payer: Self-pay | Admitting: Neurology

## 2018-02-12 DIAGNOSIS — M5432 Sciatica, left side: Secondary | ICD-10-CM

## 2018-02-12 MED ORDER — PREDNISONE 10 MG PO TABS: ORAL_TABLET | ORAL | 0 refills | Status: DC

## 2018-02-12 NOTE — Telephone Encounter (Signed)
[facility] Lumbar spine [facility]: [id number]: [id number] (exp. 02/12/18 to 03/29/18). [facility]

## 2018-02-12 NOTE — Telephone Encounter (Signed)
I called the patient.  The patient has had over the last week increased pain in the back and going down the left leg with sciatica.  The patient was placed on a prednisone Dosepak over the next 6 days.  I will get her set up for an MRI of the lumbar spine.

## 2018-02-12 NOTE — Telephone Encounter (Signed)
Pt states she is aware of her appointment on 07-01 but she is asking for a call re: pain problem she is having with her legs.  Pt states it is a shooting pain she is having.  Pt states the pain starts at her buttock and shoots down, pt states it feels as if someone is pulling 1 particular vein.  Please call

## 2018-02-12 NOTE — Addendum Note (Signed)
Addended by: York SpanielWILLIS, CHARLES K on: 02/12/2018 01:34 PM   Modules accepted: Orders

## 2018-02-19 ENCOUNTER — Ambulatory Visit: Payer: 59

## 2018-02-19 DIAGNOSIS — M5432 Sciatica, left side: Secondary | ICD-10-CM | POA: Diagnosis not present

## 2018-02-21 ENCOUNTER — Telehealth: Payer: Self-pay | Admitting: Neurology

## 2018-02-21 NOTE — Telephone Encounter (Signed)
I called the patient.  The patient does have degenerative changes in the back, we could do an epidural steroid injection if she is amenable to this.  She wants to think about this, she is trying to decide about whether to try to find another type of job that does not require so much standing or bending or stooping, or to apply for disability.  She will call our office if she does want epidural steroid injection.

## 2018-02-21 NOTE — Telephone Encounter (Signed)
Pt returning Dr. Willis call  °

## 2018-02-21 NOTE — Telephone Encounter (Signed)
I called the patient.  The MRI of the low back shows potential for L4 nerve root compression on the right more so than the left, some spinal stenosis seen to a moderate level at the L3-4 and L4-5 levels.  The patient still have a lot of back pain, we may consider an epidural steroid injection.   MRI lumbar 02/20/18:  IMPRESSION:   Abnormal MRI lumbar spine (without) demonstrating: 1. At L4-5: facet hypertrophy, ligamentum flavum hypertrophy, with moderate spinal stenosis, severe right and moderate-severe left foraminal stenosis. 2. At L3-4: facet hypertrophy, ligamentum flavum hypertrophy, with moderate spinal stenosis and mild biforaminal stenosis. 3. At L2-3: facet hypertrophy, ligamentum flavum hypertrophy, metallic artifact, with mild spinal stenosis and mild biforaminal stenosis. 4. Posterior metallic fusion rod from lower thoracic region down to L3 level.

## 2018-02-25 ENCOUNTER — Telehealth: Payer: Self-pay | Admitting: Neurology

## 2018-02-25 DIAGNOSIS — M47816 Spondylosis without myelopathy or radiculopathy, lumbar region: Secondary | ICD-10-CM

## 2018-02-25 NOTE — Telephone Encounter (Signed)
Dr. Anne HahnWillis, I had Faith look behind me but we could not find that you have ever done cortisone shots on her. She had MRI done a few days ago. Do you want to see her in the office?

## 2018-02-25 NOTE — Telephone Encounter (Signed)
I called the patient.  The patient has had some increase in her back pain and some pain behind the right knee, she is willing to get the epidural steroid injection at this point.  I will try to get this set up.

## 2018-02-25 NOTE — Addendum Note (Signed)
Addended by: York SpanielWILLIS, Jamyron Redd K on: 02/25/2018 05:00 PM   Modules accepted: Orders

## 2018-02-25 NOTE — Telephone Encounter (Signed)
Pt is asking to schedule a cortizone shot because she has had pain in her left and numbness in her foot  she says both legs are troubling her. Pt said Dr Anne HahnWillis told her pain coming from her back, both legs are bothering her.  Pt states she has had a crook in right leg for 2 days and it hurts.  Pt is asking to be called

## 2018-02-27 ENCOUNTER — Other Ambulatory Visit: Payer: Self-pay | Admitting: Neurology

## 2018-02-27 DIAGNOSIS — M47816 Spondylosis without myelopathy or radiculopathy, lumbar region: Secondary | ICD-10-CM

## 2018-03-04 ENCOUNTER — Ambulatory Visit: Payer: Managed Care, Other (non HMO) | Admitting: Nurse Practitioner

## 2018-03-08 ENCOUNTER — Other Ambulatory Visit: Payer: Self-pay | Admitting: Neurology

## 2018-03-08 ENCOUNTER — Ambulatory Visit
Admission: RE | Admit: 2018-03-08 | Discharge: 2018-03-08 | Disposition: A | Discharge status: Home / Self Care | Payer: 59 | Source: Ambulatory Visit | Attending: Neurology | Admitting: Neurology

## 2018-03-08 DIAGNOSIS — M47816 Spondylosis without myelopathy or radiculopathy, lumbar region: Secondary | ICD-10-CM

## 2018-03-08 MED ORDER — IOPAMIDOL (ISOVUE-M 200) INJECTION 41%
1.0000 mL | Freq: Once | INTRAMUSCULAR | Status: AC
  Administered 2018-03-08: 1 mL via EPIDURAL

## 2018-03-08 MED ORDER — METHYLPREDNISOLONE ACETATE 40 MG/ML INJ SUSP (RADIOLOG
120.0000 mg | Freq: Once | INTRAMUSCULAR | Status: AC
  Administered 2018-03-08: 120 mg via EPIDURAL

## 2018-03-08 NOTE — Discharge Instructions (Signed)

## 2018-03-14 NOTE — Telephone Encounter (Addendum)
Called, LVM for pt to call and set up f/u appt with Dr. Anne HahnWillis once he returns to discuss. Advised per Dr. Marjory LiesPenumalli, he has no other recommendations at this time. She should continue to monitor her sx.  If she calls, can offer 04/04/18 at 730am with Dr. Anne HahnWillis

## 2018-03-14 NOTE — Telephone Encounter (Signed)
No other recommendations at this time from my standpoint. Would recommend to monitor symptoms and discuss with Dr. Anne HahnWillis when he returns. -VRP

## 2018-03-14 NOTE — Telephone Encounter (Signed)
Pt called stating the last cortisone shot did not help, stating the pain is still consistent. Requesting a call to discus another shot or discuss different options.

## 2018-03-14 NOTE — Telephone Encounter (Signed)
Pt called office back. States ESI helped a little to relieve sx. She went home and slept after injection. She woke up Sat and it felt a little better. By Sunday, she was [facility] Pain located on her left side near top part of buttocks and radiates down her whole leg. She is also having tingling sensation. She is using heating pad on bottom at night to try and help. She denies lifting anything heavy or any recent injuries. She is unable to stand for more than a min because of pain. She cannot lay down without having pain. She has to stretch a certain way to make pain bearable. She is currently taking aleve back and muscle for pain OTC. [facility] [facility] [place]. Advised Dr. Anne HahnWillis out of the office. I will speak with [facility] and see what their recommendations are.  Advised it is probably too soon to do another ESI since she just had one on 03/08/18.

## 2018-03-14 NOTE — Telephone Encounter (Signed)
Called pt back, LVM for her to call.  Appears she had ESI on 03/08/18 in lumbar region of back. Wanting to get a little more information of how that went, if she found it beneficial and if so, for how long. Wanting to know how pain is now, location, severity, ect.

## 2018-03-18 NOTE — Telephone Encounter (Signed)
Pt called back and spoke w/ phone staff. She scheduled f/u with Dr. Anne HahnWillis on 04/04/18.

## 2018-04-04 ENCOUNTER — Encounter: Payer: Self-pay | Admitting: Neurology

## 2018-04-04 ENCOUNTER — Ambulatory Visit: Payer: BLUE CROSS/BLUE SHIELD | Admitting: Neurology

## 2018-04-04 VITALS — BP 105/69 | HR 54 | Ht 66.5 in | Wt 156.0 lb

## 2018-04-04 DIAGNOSIS — M25561 Pain in right knee: Secondary | ICD-10-CM

## 2018-04-04 DIAGNOSIS — G8929 Other chronic pain: Secondary | ICD-10-CM

## 2018-04-04 DIAGNOSIS — M5442 Lumbago with sciatica, left side: Secondary | ICD-10-CM

## 2018-04-04 MED ORDER — CYCLOBENZAPRINE HCL 10 MG PO TABS: 10.0000 mg | ORAL_TABLET | Freq: Two times a day (BID) | ORAL | 3 refills | Status: DC

## 2018-04-04 NOTE — Progress Notes (Signed)
Reason for visit: Seizures, low back pain  Pamela [name] is an 57 y.o. female  History of present illness:  Pamela [name] is a 57 year old right-handed white female with a history of seizures, on carbamazepine.  She has done quite well in this regard.  She comes back today mainly because of ongoing low back pain.  Towards the end of February 2019, the patient had spontaneous onset of low back pain with pain down the left greater than right leg.  The pain on the left goes down to the foot, the patient may have tingling sensations going into the big toe on that side.  The patient has some pain going on the right side as well but usually down to the knee area.  The patient was given a trial on gabapentin but could not tolerate the drug, she was then switched to Lyrica which seemed to work better but she could not afford the medication.  The patient was set up for an epidural steroid injection which offered minimal transient benefit.  The patient is on Aleve on a regular basis.  She is not sleeping well at night.  She feels better lying down but when she sits or stands, the pain worsens.  MRI of the lumbar spine was done, this shows multilevel degenerative changes, most significant at L4-5 level with severe neuroforaminal stenosis on the right, moderate on the left.  The patient returns to the office today for an evaluation.  One month ago, she fell when her right leg collapsed, she has injured her right knee, the knee is swollen and she reports crepitus in the knee.  She is now wearing a knee brace.  She has not sought medical attention for the knee.  Past Medical History:  Diagnosis Date  . Anxiety    takes Effexor  . Arthritis    neck and lower back  . Carpal tunnel syndrome of right wrist 09/2017  . Degenerative disc disease, lumbar   . Depression   . Epilepsy (HCC)    last seizure 15 - 20 years  . Gastric ulcer   . GERD (gastroesophageal reflux disease)   . Heart murmur    no  known problems, per pt. - no cardiologist  . History of hiatal hernia   . History of scoliosis   . History of substance abuse   . Pinched nerve    left elbow  . Seasonal allergies   . Wears dentures    full    Past Surgical History:  Procedure Laterality Date  . ABDOMINAL HYSTERECTOMY     partial  . APPENDECTOMY  12/01/2008  . BILATERAL SALPINGOOPHORECTOMY  12/01/2008  . CARPAL TUNNEL RELEASE Right 10/04/2017   Procedure: RIGHT CARPAL TUNNEL RELEASE;  Surgeon: Cindee SaltKuzma, Gary, MD;  Location: [facility];  Service: Orthopedics;  Laterality: Right;  . CARPAL TUNNEL RELEASE Left 11/20/2017   Procedure: LEFT CARPAL TUNNEL RELEASE;  Surgeon: Cindee SaltKuzma, Gary, MD;  Location: [facility];  Service: Orthopedics;  Laterality: Left;  . EYE SURGERY     tear duct surgery  . LUMBAR LAMINECTOMY/DECOMPRESSION MICRODISCECTOMY N/A 07/29/2014   Procedure: LUMBAR LAMINECTOMY/DECOMPRESSION MICRODISCECTOMY;  Surgeon: Emilee HeroMark Leonard Dumonski, MD;  Location: [facility] OR;  Service: Orthopedics;  Laterality: N/A;  Lumbar 4-5 decompression  . SHOULDER ARTHROSCOPY Right   . SPINAL FIXATION SURGERY     Harrington rods    Family History  Problem Relation Age of Onset  . Prostate cancer Father   . Diabetes Father   .  Breast cancer Mother   . Prostate cancer Brother   . Hepatitis C Brother     Social history:  reports that she has been smoking cigarettes.  She has a 31.00 pack-year smoking history. She has never used smokeless tobacco. She reports that she does not drink alcohol or use drugs.    Allergies  Allergen Reactions  . Equate Nicotine [Nicotine] Shortness Of Breath    LETHARGY, TREMORS  . Celebrex [Celecoxib] Other (See Comments)    LETHARGY, TREMORS  . Lactose Intolerance (Gi) Other (See Comments)    GI UPSET  . Zoloft [Sertraline Hcl] Other (See Comments)    LETHARGY, TREMORS     Medications:  Prior to Admission medications   Medication Sig Start Date End Date Taking?  Authorizing Provider  carbamazepine (TEGRETOL XR) 200 MG 12 hr tablet Take 200 mg by mouth 2 (two) times daily.   Yes [provider]  Multiple Vitamin (MULTIVITAMIN) tablet Take 1 tablet by mouth daily.   Yes [provider]  naproxen sodium (ALEVE) 220 MG tablet Take 220 mg by mouth 2 (two) times daily as needed. Taking aleve back and muscle pain relief   Yes [provider]  Probiotic Product (PROBIOTIC DAILY PO) Take 1 capsule by mouth daily.   Yes [provider]  TRAMADOL HCL PO Take 1 Dose by mouth 3 (three) times daily.   Yes [provider]  venlafaxine XR (EFFEXOR-XR) 150 MG 24 hr capsule TAKE 1 CAPSULE (150 MG TOTAL) BY MOUTH DAILY WITH BREAKFAST. 12/27/17  Yes York Spaniel, MD    ROS:  Out of a complete 14 system review of symptoms, the patient complains only of the following symptoms, and all other reviewed systems are negative.  Back pain, leg pain  Blood pressure 105/69, pulse (!) 54, height 5' 6.5" (1.689 m), weight 156 lb (70.8 kg).  Physical Exam  General: The patient is alert and cooperative at the time of the examination.  Neuromuscular: The patient is able to flex to a normal degree with the low back.  No significant discomfort with palpation of the low back is noted.  Skin: No significant peripheral edema is noted.   Neurologic Exam  Mental status: The patient is alert and oriented x 3 at the time of the examination. The patient has apparent normal recent and remote memory, with an apparently normal attention span and concentration ability.   Cranial nerves: Facial symmetry is present. Speech is normal, no aphasia or dysarthria is noted. Extraocular movements are full. Visual fields are full.  Motor: The patient has good strength in all 4 extremities.  Sensory examination: Soft touch sensation is symmetric on the face, arms, and legs.  Coordination: The patient has good finger-nose-finger and heel-to-shin  bilaterally.  Gait and station: The patient has a normal gait. Tandem gait is normal. Romberg is negative. No drift is seen.  The patient is able to walk on heels and the toes bilaterally.  Reflexes: Deep tendon reflexes are symmetric.    MRI lumbar 02/20/18:  IMPRESSION:   Abnormal MRI lumbar spine (without) demonstrating: 1. At L4-5: facet hypertrophy, ligamentum flavum hypertrophy, with moderate spinal stenosis, severe right and moderate-severe left foraminal stenosis. 2. At L3-4: facet hypertrophy, ligamentum flavum hypertrophy, with moderate spinal stenosis and mild biforaminal stenosis. 3. At L2-3: facet hypertrophy, ligamentum flavum hypertrophy, metallic artifact, with mild spinal stenosis and mild biforaminal stenosis. 4. Posterior metallic fusion rod from lower thoracic region down to L3 level.  Assessment/Plan:  1.  Low back pain, left greater than right leg discomfort  2.  History of seizures, well controlled  The patient is on Effexor and carbamazepine, she will be given Flexeril to help her back and hopefully help her sleep better.  The patient will continue on the Aleve.  The patient will be set up for second epidural steroid injection, she will undergo nerve conduction studies on both legs and EMG on both legs.  The patient will be referred to orthopedic surgery for her right knee injury.  The patient will follow-up in about 3 months.  If the repeat epidural steroid injection is not helpful, the patient may require a surgical referral for her back.  Marlan Palau MD 04/04/2018 7:27 AM  [facility] [address] [place], [place] [address]  Phone [phone] Fax [phone]

## 2018-04-04 NOTE — Patient Instructions (Signed)
We will get another epidural steroid injection, we will make a referral concerning the right knee pain to orthopedics.  Start flexeril 10 mg twice a day.  We will get EMG and NCV to look at the nerve function of the legs.

## 2018-04-08 ENCOUNTER — Telehealth: Payer: Self-pay | Admitting: Neurology

## 2018-04-08 ENCOUNTER — Other Ambulatory Visit: Payer: Self-pay | Admitting: Neurology

## 2018-04-08 DIAGNOSIS — G8929 Other chronic pain: Secondary | ICD-10-CM

## 2018-04-08 DIAGNOSIS — M5442 Lumbago with sciatica, left side: Principal | ICD-10-CM

## 2018-04-08 NOTE — Telephone Encounter (Signed)
[facility] relayed that patient is getting new insurance and she will call them back to schedule her apt . Referral.

## 2018-04-15 ENCOUNTER — Other Ambulatory Visit: Payer: Self-pay | Admitting: Neurology

## 2018-04-15 ENCOUNTER — Ambulatory Visit
Admission: RE | Admit: 2018-04-15 | Discharge: 2018-04-15 | Disposition: A | Discharge status: Home / Self Care | Payer: 59 | Source: Ambulatory Visit | Attending: Neurology | Admitting: Neurology

## 2018-04-15 DIAGNOSIS — M5442 Lumbago with sciatica, left side: Principal | ICD-10-CM

## 2018-04-15 DIAGNOSIS — G8929 Other chronic pain: Secondary | ICD-10-CM

## 2018-04-15 MED ORDER — IOPAMIDOL (ISOVUE-M 200) INJECTION 41%
1.0000 mL | Freq: Once | INTRAMUSCULAR | Status: AC
  Administered 2018-04-15: 1 mL via EPIDURAL

## 2018-04-15 MED ORDER — METHYLPREDNISOLONE ACETATE 40 MG/ML INJ SUSP (RADIOLOG
120.0000 mg | Freq: Once | INTRAMUSCULAR | Status: AC
  Administered 2018-04-15: 120 mg via EPIDURAL

## 2018-04-15 NOTE — Discharge Instructions (Signed)

## 2018-05-03 IMAGING — MR MR CERVICAL SPINE W/O CM
5 series · 29 of 48 positions shown · non-contrast
Comparison: Cervical spine radiographs 08/07/2017

CLINICAL DATA: Neck pain radiating into both shoulders and arms
into the hands. Associated weakness. Numbness in the upper and lower
extremities.

EXAM:
MRI CERVICAL SPINE WITHOUT CONTRAST
TECHNIQUE: Multiplanar, multisequence MR imaging of the cervical spine was
performed. No intravenous contrast was administered.

[Series 2: T2 · sagittal · 3.0mm · 0.41mm/px · 6 of 15 slices shown (1 of 2)]
[im 1/15]
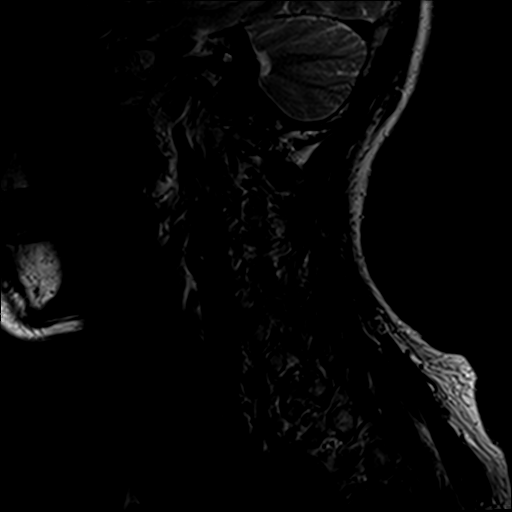
[im 3/15]
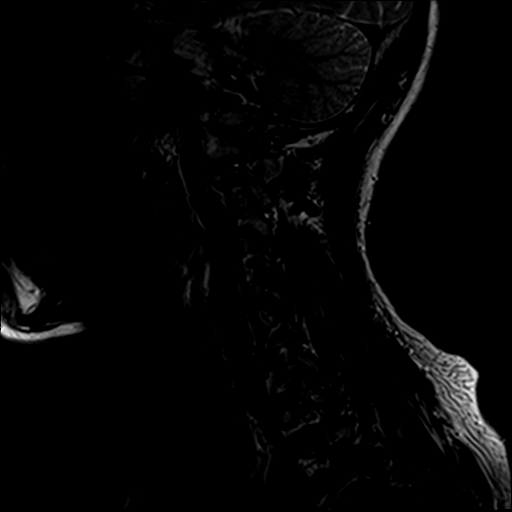
[im 6/15]
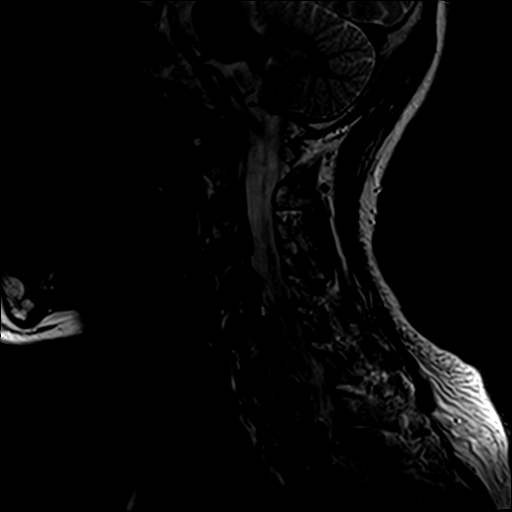
[im 9/15]
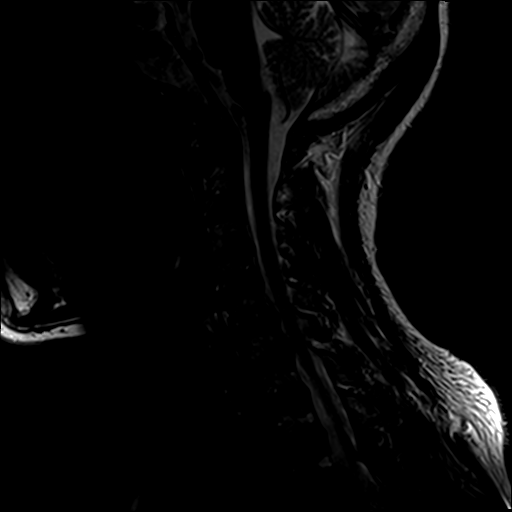
[im 12/15]
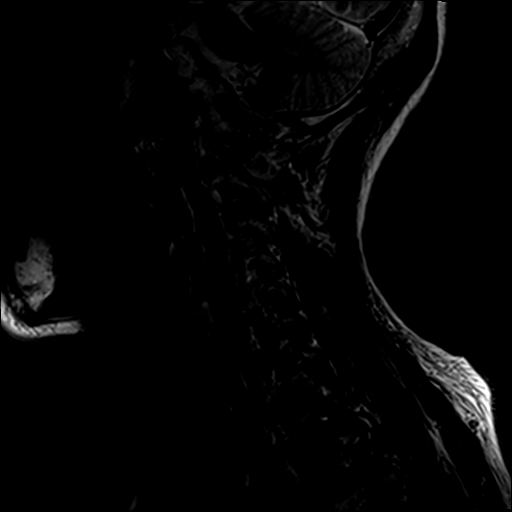
[im 15/15]
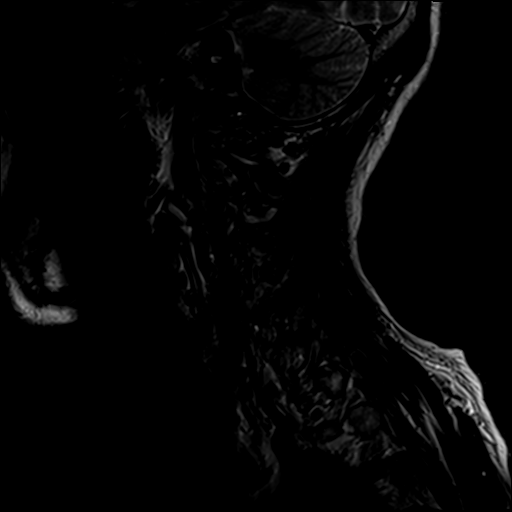

[Series 3: T1 · sagittal · 3.0mm · 0.41mm/px · 6 of 15 slices shown]
[im 1/15]
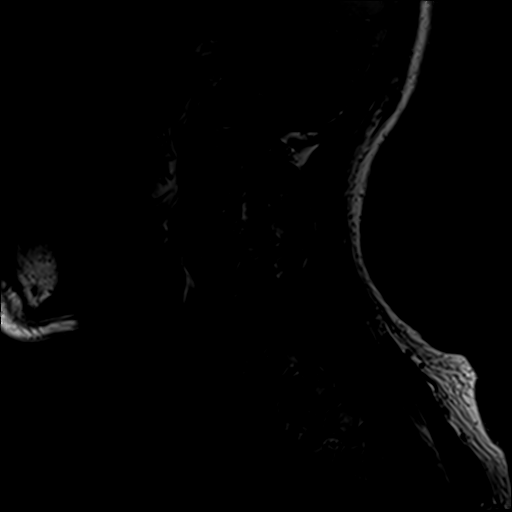
[im 3/15]
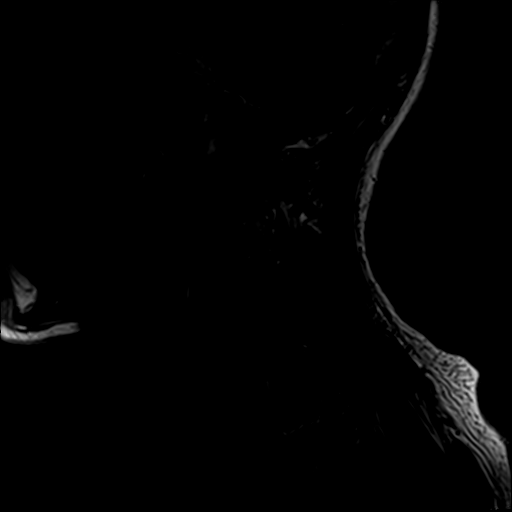
[im 6/15]
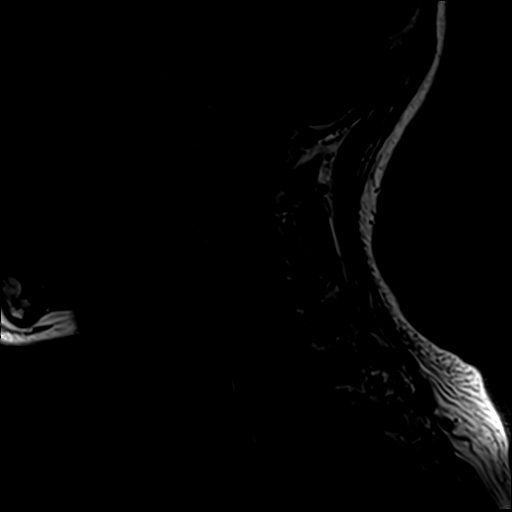
[im 9/15]
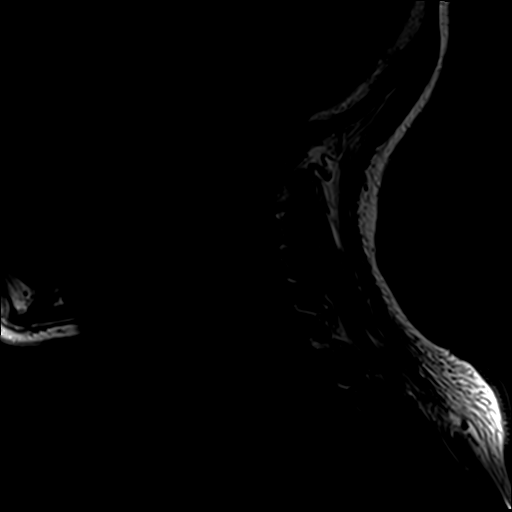
[im 12/15]
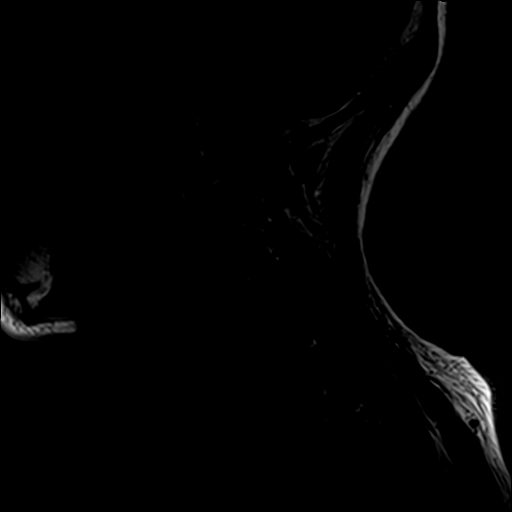
[im 15/15]
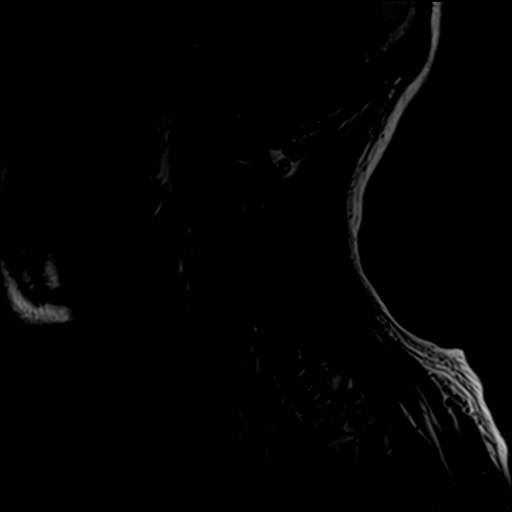

[Series 4: STIR · sagittal · 3.0mm · 0.82mm/px · 6 of 15 slices shown]
[im 1/15]
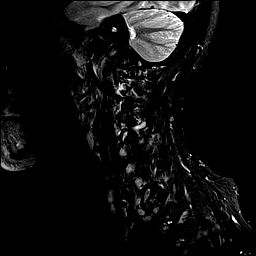
[im 3/15]
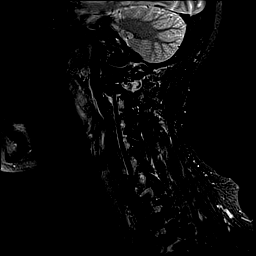
[im 6/15]
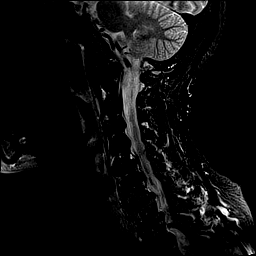
[im 9/15]
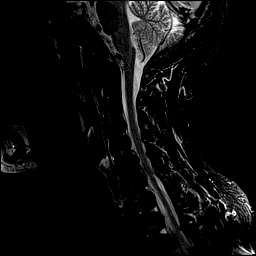
[im 12/15]
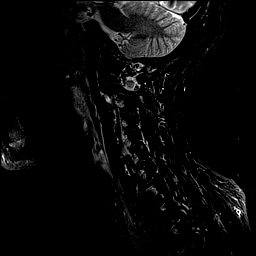
[im 15/15]
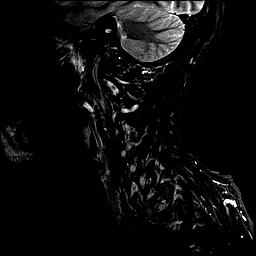

[Series 5: GRE · axial · 3.0mm · 0.35mm/px · z∈[-128,-110]mm · 2 of 39 slices shown]
[im 1/39]
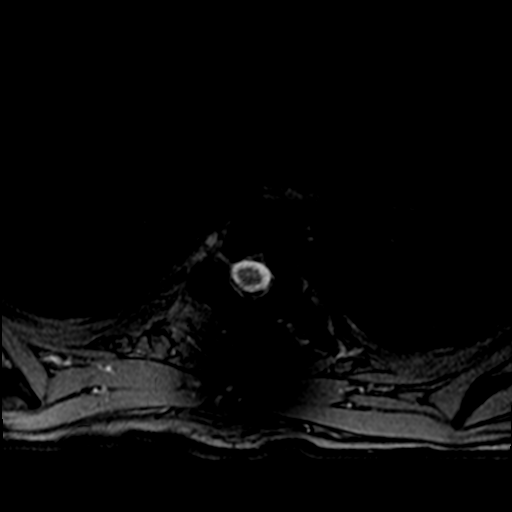
[im 6/39]
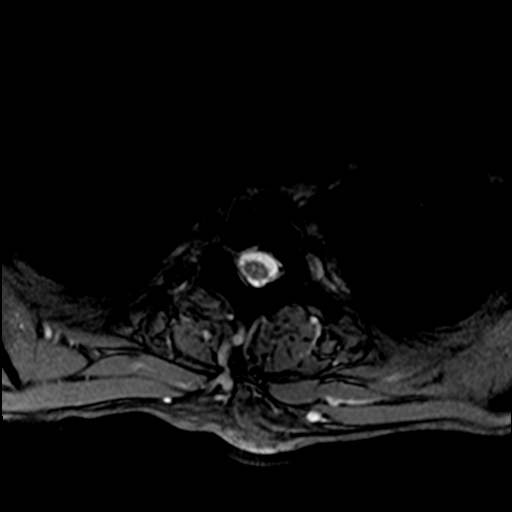

[Series 6: T2 · axial · 3.0mm · 0.70mm/px · z∈[-128,+12]mm · 9 of 39 slices shown (2 of 2)]
[im 1/39]
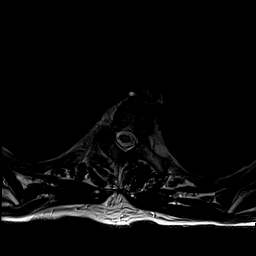
[im 6/39]
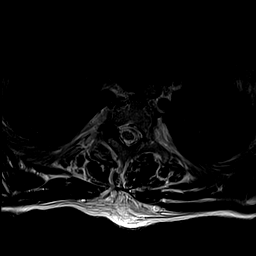
[im 11/39]
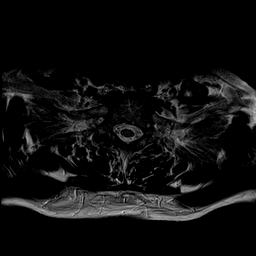
[im 17/39]
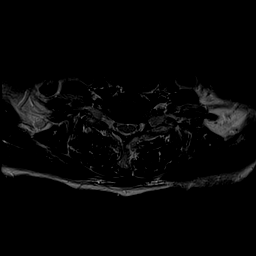
[im 20/39]
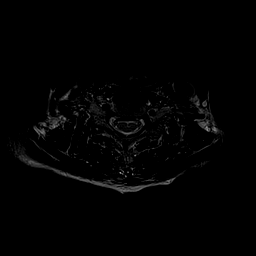
[im 22/39]
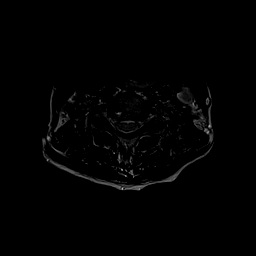
[im 28/39]
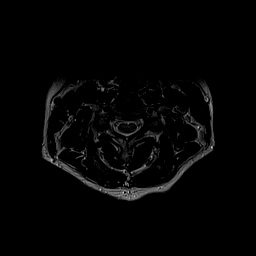
[im 33/39]
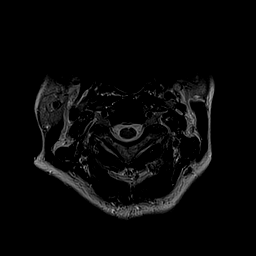
[im 39/39]
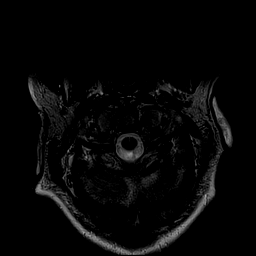

[29 of 48 positions shown; findings below may reference images not displayed]

FINDINGS: Alignment: Cervical spine straightening.  No listhesis.

Vertebrae: No fracture or destructive osseous lesion. Minimal
degenerative endplate edema in the lower cervical and upper thoracic
spine.

Cord: Normal signal and morphology.

Posterior Fossa, vertebral arteries, paraspinal tissues:
Unremarkable.

Disc levels:

C2-3:  Mild uncovertebral spurring without stenosis.

C3-4: Mild disc bulging and uncovertebral spurring without stenosis.

C4-5:  Mild disc bulging without stenosis.

C5-6: Mild disc space narrowing. Disc bulging and uncovertebral
spurring result in mild spinal stenosis without neural foraminal
stenosis.

C6-7: Mild disc space narrowing. Disc bulging and uncovertebral
spurring result in mild spinal stenosis without neural foraminal
stenosis.

C7-T1: Mild disc space narrowing. Disc bulging and uncovertebral
spurring result in moderate right and borderline left neural
foraminal stenosis without spinal stenosis. Potential right C8 nerve
root impingement.

T1-2: Mild disc space narrowing. Disc bulging and spurring result in
moderate bilateral neural foraminal stenosis without spinal
stenosis.

T2-3: Mild disc space narrowing. Disc bulging asymmetric to the
right and moderate right facet arthrosis result in moderate right
neural foraminal stenosis. No spinal stenosis.
IMPRESSION: 1. Multilevel cervical disc degeneration resulting in mild spinal
stenosis at C5-6 and C6-7.
2. Moderate neural foraminal stenosis on the right at C7-T1 and T2-3
and bilaterally at T1-2.

## 2018-05-08 ENCOUNTER — Ambulatory Visit (INDEPENDENT_AMBULATORY_CARE_PROVIDER_SITE_OTHER): Payer: BLUE CROSS/BLUE SHIELD | Admitting: Neurology

## 2018-05-08 ENCOUNTER — Encounter: Payer: Self-pay | Admitting: Neurology

## 2018-05-08 ENCOUNTER — Ambulatory Visit: Payer: BLUE CROSS/BLUE SHIELD | Admitting: Neurology

## 2018-05-08 DIAGNOSIS — M79604 Pain in right leg: Secondary | ICD-10-CM

## 2018-05-08 DIAGNOSIS — M545 Low back pain: Secondary | ICD-10-CM

## 2018-05-08 DIAGNOSIS — M5442 Lumbago with sciatica, left side: Secondary | ICD-10-CM | POA: Diagnosis not present

## 2018-05-08 DIAGNOSIS — G8929 Other chronic pain: Secondary | ICD-10-CM

## 2018-05-08 MED ORDER — TIZANIDINE HCL 2 MG PO TABS: 2.0000 mg | ORAL_TABLET | Freq: Three times a day (TID) | ORAL | 1 refills | Status: DC

## 2018-05-08 NOTE — Progress Notes (Addendum)
The patient comes in today for EMG nerve conduction study evaluation.  Nerve conduction studies of the lower extremities were relatively unremarkable, no neuropathy is seen.  EMG studies of both legs appear to be relatively unremarkable.  The patient is having ongoing discomfort down both legs, to the knee on the right and to the foot on the left.  The patient has had 2 epidural steroid injections without much benefit.  I have recommended facet joint injections, the patient does not wish to pursue this procedure.  We will place the patient on tizanidine 2 mg 3 times daily, she could not take gabapentin or Lyrica previously, she is on Effexor, she takes some Flexeril.  She is also on carbamazepine.  The patient takes an anti-inflammatory medication as well.  The patient is frustrated with the lack of a definitive treatment for her back pain.  She is also having significant problems with her right knee, she is seeing orthopedic surgery for this.    MNC    Nerve / Sites Muscle Latency Ref. Amplitude Ref. Rel Amp Segments Distance Velocity Ref. Area    ms ms mV mV %  cm m/s m/s mVms  L Peroneal - EDB     Ankle EDB 4.8 ?6.5 5.0 ?2.0 100 Ankle - EDB 9   16.6     Fib head EDB 12.2  4.4  89.3 Fib head - Ankle 37 50 ?44 15.2     Pop fossa EDB 14.0  4.8  108 Pop fossa - Fib head 10 55 ?44 15.8         Pop fossa - Ankle      R Peroneal - EDB     Ankle EDB 5.2 ?6.5 3.8 ?2.0 100 Ankle - EDB 9   10.8     Fib head EDB 13.6  3.2  84.2 Fib head - Ankle 37 44 ?44 10.1     Pop fossa EDB 15.8  3.5  110 Pop fossa - Fib head 10 45 ?44 12.2         Pop fossa - Ankle      L Tibial - AH     Ankle AH 5.7 ?5.8 6.3 ?4.0 100 Ankle - AH 9   14.7     Pop fossa AH 15.3  5.3  85.1 Pop fossa - Ankle 41 43 ?41 14.5  R Tibial - AH     Ankle AH 5.5 ?5.8 11.0 ?4.0 100 Ankle - AH 9   22.0     Pop fossa AH 15.2  8.2  74.1 Pop fossa - Ankle 41 43 ?41 21.1             SNC    Nerve / Sites Rec. Site Peak Lat Ref.  Amp Ref.  Segments Distance    ms ms V V  cm  L Sural - Ankle (Calf)     Calf Ankle 3.6 ?4.4 6 ?6 Calf - Ankle 14  R Sural - Ankle (Calf)     Calf Ankle 3.4 ?4.4 6 ?6 Calf - Ankle 14  L Superficial peroneal - Ankle     Lat leg Ankle 4.4 ?4.4 7 ?6 Lat leg - Ankle 14  R Superficial peroneal - Ankle     Lat leg Ankle 4.0 ?4.4 8 ?6 Lat leg - Ankle 14              F  Wave    Nerve F Lat Ref.   ms ms  L Tibial -  AH 53.6 ?56.0  R Tibial - AH 54.8 ?56.0

## 2018-05-08 NOTE — Progress Notes (Signed)
Please refer to EMG and nerve conduction study procedure note. 

## 2018-05-08 NOTE — Procedures (Signed)
     HISTORY:  Pamela [name] is a 57 year old patient with a history of chronic low back pain and left greater than right lower extremity pain, the patient will have episodes where her legs will collapse and she will fall.  The patient is being evaluated for the pain.  NERVE CONDUCTION STUDIES:  Nerve conduction studies were performed on both lower extremities. The distal motor latencies and motor amplitudes for the peroneal and posterior tibial nerves were within normal limits. The nerve conduction velocities for these nerves were also normal. The sensory latencies for the peroneal and sural nerves were within normal limits. The F wave latencies for the posterior tibial nerves were within normal limits.   EMG STUDIES:  EMG study was performed on the right lower extremity:  The tibialis anterior muscle reveals 2 to 4K motor units with full recruitment. No fibrillations or positive waves were seen. The peroneus tertius muscle reveals 2 to 4K motor units with full recruitment. No fibrillations or positive waves were seen. The medial gastrocnemius muscle reveals 1 to 3K motor units with full recruitment. No fibrillations or positive waves were seen. The vastus lateralis muscle reveals 2 to 4K motor units with full recruitment. No fibrillations or positive waves were seen. The iliopsoas muscle reveals 2 to 4K motor units with full recruitment. No fibrillations or positive waves were seen. The biceps femoris muscle (long head) reveals 2 to 4K motor units with full recruitment. No fibrillations or positive waves were seen. The lumbosacral paraspinal muscles were tested at 3 levels, and revealed no abnormalities of insertional activity at all 3 levels tested. There was good relaxation.  EMG study was performed on the left lower extremity:  The tibialis anterior muscle reveals 2 to 4K motor units with full recruitment. No fibrillations or positive waves were seen. The peroneus tertius muscle  reveals 2 to 4K motor units with full recruitment. No fibrillations or positive waves were seen. The medial gastrocnemius muscle reveals 1 to 3K motor units with full recruitment. No fibrillations or positive waves were seen. The vastus lateralis muscle reveals 2 to 4K motor units with full recruitment. No fibrillations or positive waves were seen. The iliopsoas muscle reveals 2 to 4K motor units with full recruitment. No fibrillations or positive waves were seen. The biceps femoris muscle (long head) reveals 2 to 4K motor units with full recruitment. No fibrillations or positive waves were seen. The lumbosacral paraspinal muscles were tested at 3 levels, and revealed no abnormalities of insertional activity at all 3 levels tested. There was good relaxation.   IMPRESSION:  Nerve conduction studies done on both lower extremities were unremarkable without evidence of an underlying peripheral neuropathy.  EMG evaluation of both lower extremities were unremarkable, no evidence of an overlying lumbosacral radiculopathy was seen.  Marlan Palau MD 05/08/2018 2:01 PM  [facility] [address] [place], [place] [address]  Phone [phone] Fax [phone]

## 2018-06-04 DIAGNOSIS — Z0271 Encounter for disability determination: Secondary | ICD-10-CM

## 2018-07-03 ENCOUNTER — Other Ambulatory Visit: Payer: Self-pay | Admitting: Neurology

## 2018-07-09 ENCOUNTER — Other Ambulatory Visit: Payer: Self-pay

## 2018-07-09 ENCOUNTER — Ambulatory Visit: Payer: BLUE CROSS/BLUE SHIELD | Admitting: Neurology

## 2018-07-09 ENCOUNTER — Encounter: Payer: Self-pay | Admitting: Neurology

## 2018-07-09 VITALS — BP 114/68 | HR 66 | Resp 16 | Ht 66.5 in | Wt 176.5 lb

## 2018-07-09 DIAGNOSIS — M545 Low back pain, unspecified: Secondary | ICD-10-CM

## 2018-07-09 DIAGNOSIS — M79604 Pain in right leg: Secondary | ICD-10-CM

## 2018-07-09 DIAGNOSIS — G40409 Other generalized epilepsy and epileptic syndromes, not intractable, without status epilepticus: Secondary | ICD-10-CM | POA: Diagnosis not present

## 2018-07-09 MED ORDER — CYCLOBENZAPRINE HCL 10 MG PO TABS: 10.0000 mg | ORAL_TABLET | Freq: Two times a day (BID) | ORAL | 5 refills | Status: DC

## 2018-07-09 MED ORDER — PREGABALIN 50 MG PO CAPS: 50.0000 mg | ORAL_CAPSULE | Freq: Two times a day (BID) | ORAL | 3 refills | Status: DC

## 2018-07-09 MED ORDER — VENLAFAXINE HCL ER 75 MG PO CP24: 225.0000 mg | ORAL_CAPSULE | Freq: Every day | ORAL | 1 refills | Status: DC

## 2018-07-09 NOTE — Progress Notes (Signed)
Reason for visit: Chronic back pain  Pamela [name] is an 57 y.o. female  History of present illness:  Pamela [name] is a 57 year old right-handed black female with a history of chronic pain.  The patient has diffuse neuromuscular pain in the neck and shoulders, but she has a lot of back pain and pain going down the left leg to the foot with tingling in the foot.  EMG nerve conduction study did not show evidence of nerve root injury.  The patient has developed some right knee pain following the fall, she is seen through orthopedic surgery for this.  The patient is also followed for bilateral carpal tunnel syndrome.  She is on Effexor taking 150 mg daily, she takes Mobic 15 mg daily, she has had injections in the right knee with transient benefit.  Epidural steroid injections in the back offered minimal benefit.  The patient has gotten benefit from Lyrica previously but could not afford the medication.  Gabapentin did not help.  The patient returns to this office for an evaluation.  The patient finds that if she does any amount of housework she will have significant increase in pain.  Past Medical History:  Diagnosis Date  . Anxiety    takes Effexor  . Arthritis    neck and lower back  . Carpal tunnel syndrome of right wrist 09/2017  . Degenerative disc disease, lumbar   . Depression   . Epilepsy (HCC)    last seizure 15 - 20 years  . Gastric ulcer   . GERD (gastroesophageal reflux disease)   . Heart murmur    no known problems, per pt. - no cardiologist  . History of hiatal hernia   . History of scoliosis   . History of substance abuse (HCC)   . Pinched nerve    left elbow  . Seasonal allergies   . Wears dentures    full    Past Surgical History:  Procedure Laterality Date  . ABDOMINAL HYSTERECTOMY     partial  . APPENDECTOMY  12/01/2008  . BILATERAL SALPINGOOPHORECTOMY  12/01/2008  . CARPAL TUNNEL RELEASE Right 10/04/2017   Procedure: RIGHT CARPAL TUNNEL RELEASE;   Surgeon: Cindee Salt, MD;  Location: [facility];  Service: Orthopedics;  Laterality: Right;  . CARPAL TUNNEL RELEASE Left 11/20/2017   Procedure: LEFT CARPAL TUNNEL RELEASE;  Surgeon: Cindee Salt, MD;  Location: [facility];  Service: Orthopedics;  Laterality: Left;  . EYE SURGERY     tear duct surgery  . LUMBAR LAMINECTOMY/DECOMPRESSION MICRODISCECTOMY N/A 07/29/2014   Procedure: LUMBAR LAMINECTOMY/DECOMPRESSION MICRODISCECTOMY;  Surgeon: Emilee Hero, MD;  Location: [facility] OR;  Service: Orthopedics;  Laterality: N/A;  Lumbar 4-5 decompression  . SHOULDER ARTHROSCOPY Right   . SPINAL FIXATION SURGERY     Harrington rods    Family History  Problem Relation Age of Onset  . Prostate cancer Father   . Diabetes Father   . Breast cancer Mother   . Prostate cancer Brother   . Hepatitis C Brother     Social history:  reports that she has been smoking cigarettes. She has a 31.00 pack-year smoking history. She has never used smokeless tobacco. She reports that she does not drink alcohol or use drugs.    Allergies  Allergen Reactions  . Equate Nicotine [Nicotine] Shortness Of Breath    LETHARGY, TREMORS  . Celebrex [Celecoxib] Other (See Comments)    LETHARGY, TREMORS  . Lactose Intolerance (Gi) Other (See Comments)  GI UPSET  . Zoloft [Sertraline Hcl] Other (See Comments)    LETHARGY, TREMORS     Medications:  Prior to Admission medications   Medication Sig Start Date End Date Taking? Authorizing Provider  carbamazepine (TEGRETOL XR) 200 MG 12 hr tablet Take 200 mg by mouth 2 (two) times daily.   Yes [provider]  cyclobenzaprine (FLEXERIL) 10 MG tablet Take 1 tablet (10 mg total) by mouth 2 (two) times daily. 04/04/18  Yes York Spaniel, MD  meloxicam (MOBIC) 15 MG tablet TAKE 1 TABLET (15 MG TOTAL) BY MOUTH DAILY. 04/22/18  Yes [provider]  Multiple Vitamin (MULTIVITAMIN) tablet Take 1 tablet by mouth daily.   Yes  [provider]  Probiotic Product (PROBIOTIC DAILY PO) Take 1 capsule by mouth daily.   Yes [provider]  TRAMADOL HCL PO Take 1 Dose by mouth 3 (three) times daily.   Yes [provider]  venlafaxine XR (EFFEXOR-XR) 150 MG 24 hr capsule TAKE 1 CAPSULE (150 MG TOTAL) BY MOUTH DAILY WITH BREAKFAST. 12/27/17  Yes York Spaniel, MD    ROS:  Out of a complete 14 system review of symptoms, the patient complains only of the following symptoms, and all other reviewed systems are negative.  Eye itching, eye redness Joint pain, back pain, aching muscles, walking difficulty, neck pain, neck stiffness Environmental allergies Dizziness, numbness, weakness Anxiety  There were no vitals taken for this visit.  Physical Exam  General: The patient is alert and cooperative at the time of the examination.  Neuromuscular: The patient is able to flex to about 110 degrees with the low back, she has some limitation of rotational movement.  Skin: No significant peripheral edema is noted.   Neurologic Exam  Mental status: The patient is alert and oriented x 3 at the time of the examination. The patient has apparent normal recent and remote memory, with an apparently normal attention span and concentration ability.   Cranial nerves: Facial symmetry is present. Speech is normal, no aphasia or dysarthria is noted. Extraocular movements are full. Visual fields are full.  Motor: The patient has good strength in all 4 extremities.  Sensory examination: Soft touch sensation is symmetric on the face, arms, and legs.  Coordination: The patient has good finger-nose-finger and heel-to-shin bilaterally.  Gait and station: The patient has a normal gait. Tandem gait is slightly unsteady. Romberg is negative. No drift is seen.  Reflexes: Deep tendon reflexes are symmetric.   MRI lumbar 02/19/18:  IMPRESSION:   Abnormal MRI lumbar spine (without) demonstrating: 1. At L4-5:  facet hypertrophy, ligamentum flavum hypertrophy, with moderate spinal stenosis, severe right and moderate-severe left foraminal stenosis. 2. At L3-4: facet hypertrophy, ligamentum flavum hypertrophy, with moderate spinal stenosis and mild biforaminal stenosis. 3. At L2-3: facet hypertrophy, ligamentum flavum hypertrophy, metallic artifact, with mild spinal stenosis and mild biforaminal stenosis. 4. Posterior metallic fusion rod from lower thoracic region down to L3 level.  * MRI scan images were reviewed online. I agree with the written report.    Assessment/Plan:  1.  Chronic low back pain  2.  Bilateral carpal tunnel syndrome  3.  Right knee pain  4.  Neck and shoulder discomfort  The patient will be increased on the Effexor to 225 mg a day.  She will continue the Mobic, a prescription for the Flexeril was sent in.  The patient will be started back on Lyrica now that it is generic taking 50 mg twice daily, she  may try to get the cost reduced by using the GoodRx card.  The patient will follow-up in 6 months.  If the low back pain persists, a surgical referral may be indicated.  Marlan Palau MD 07/09/2018 10:41 AM  [facility] [address] [place], [place] [address]  Phone [phone] Fax [phone]

## 2018-07-09 NOTE — Patient Instructions (Signed)
We will go up on the Effexor to 225 mg daily.  Use the flexeril if needed for back spasms.  We will go back on Lyrica 50 mg twice a day.

## 2018-08-06 DIAGNOSIS — Z0271 Encounter for disability determination: Secondary | ICD-10-CM

## 2018-09-05 ENCOUNTER — Other Ambulatory Visit: Payer: Self-pay | Admitting: Adult Health

## 2018-09-05 NOTE — Telephone Encounter (Signed)
I would call the patient and ask if she is taking Keppra and or Carbamazepine.

## 2018-09-06 ENCOUNTER — Telehealth: Payer: Self-pay | Admitting: *Deleted

## 2018-09-06 MED ORDER — LEVETIRACETAM 500 MG PO TABS: 500.0000 mg | ORAL_TABLET | Freq: Two times a day (BID) | ORAL | 1 refills | Status: DC

## 2018-09-06 NOTE — Telephone Encounter (Signed)
Called patient and sent phone note to Dr Anne Hahn for clarification, new Rx for Keppra if indicated.

## 2018-09-06 NOTE — Telephone Encounter (Signed)
Called patient to discuss request to refill Keppra as it is not on her medication list. It was last refilled by Dolores Hoose 06/2017 x 1 year. The office notes state seizures controlled on carbamazepine. The patient got her medication bottle of Keppra and verified it was refilled by Dolores Hoose; she got last refill on 06/21/18, 3 month's supply. She is taking one tab twice daily. She stated the carbamazepine was changed to Keppra some time ago.  This RN researched office notes, found note dated 11/30/15 stating carbamazepine tapered off, Keppra started. Will route to Dr Anne Hahn for clarification and request for Keppra prescription if appropriate.  Thanked patient for her information. She  verbalized understanding, appreciation.

## 2018-09-06 NOTE — Telephone Encounter (Signed)
The note from October 2018 indicates the patient is on Keppra 500 mg twice daily, my note from March 2019 indicates the patient is on carbamazepine and the Keppra is no longer on the medication list.  I cannot explain this change, there is nothing to document a switch over from Keppra to carbamazepine.  I will go ahead and call in the prescription for the Keppra.

## 2018-11-20 ENCOUNTER — Other Ambulatory Visit: Payer: Self-pay | Admitting: Neurology

## 2019-01-07 ENCOUNTER — Other Ambulatory Visit: Payer: Self-pay | Admitting: Neurology

## 2019-01-29 ENCOUNTER — Telehealth: Payer: Self-pay

## 2019-01-29 ENCOUNTER — Telehealth: Payer: Self-pay | Admitting: Neurology

## 2019-01-29 NOTE — Telephone Encounter (Signed)
Noted  

## 2019-01-29 NOTE — Telephone Encounter (Signed)
Pt understands that although there may be some limitations with this type of visit, we will take all precautions to reduce any security or privacy concerns.  Pt understands that this will be treated like an in office visit and we will file with pt's insurance, and there may be a patient responsible charge related to this service.  Link was sent to [email or web address]

## 2019-01-29 NOTE — Telephone Encounter (Addendum)
I attempted to reach the pt and left a vm. I was trying to reach her to reschedule her 01/31/19 appt due to our office hours being Monday-Thursday right now because of covid 19. Pt was advised to call back so we could reschedule. Ok to schedule with NP. Pt has been removed from 01/31/19 schedule.

## 2019-01-31 ENCOUNTER — Ambulatory Visit: Payer: BLUE CROSS/BLUE SHIELD | Admitting: Neurology

## 2019-02-05 ENCOUNTER — Ambulatory Visit: Payer: BLUE CROSS/BLUE SHIELD | Admitting: Neurology

## 2019-02-25 ENCOUNTER — Other Ambulatory Visit: Payer: Self-pay | Admitting: Neurology

## 2019-03-25 ENCOUNTER — Other Ambulatory Visit: Payer: Self-pay | Admitting: Neurology

## 2019-03-27 ENCOUNTER — Other Ambulatory Visit: Payer: Self-pay | Admitting: Neurology

## 2019-05-02 ENCOUNTER — Other Ambulatory Visit: Payer: Self-pay | Admitting: Neurology

## 2019-06-16 ENCOUNTER — Other Ambulatory Visit: Payer: Self-pay | Admitting: Neurology

## 2019-09-15 ENCOUNTER — Other Ambulatory Visit: Payer: Self-pay | Admitting: Neurology

## 2019-09-29 ENCOUNTER — Other Ambulatory Visit: Payer: Self-pay | Admitting: Neurology

## 2019-10-01 ENCOUNTER — Telehealth: Payer: Self-pay

## 2019-10-01 NOTE — Telephone Encounter (Signed)
IF patient calls back please schedule her with Madelin Rear NP whoever has the first available. PT was last seen 07/2018 and is requesting keppra and flexeril refills.  Vm was left for pt explaining appt is needed asap.

## 2019-10-02 NOTE — Telephone Encounter (Signed)
IF patient calls back please schedule her with Megan NP,Sarah NP whoever has the first available. PT was last seen 07/2018 and is requesting keppra and flexeril refills.  Vm was left for pt explaining appt is needed asap. 

## 2019-10-02 NOTE — Telephone Encounter (Signed)
Patient stated she is now seeing doctors through [facility] due to insurance change no appt needed and she will follow up with them for refills

## 2019-10-02 NOTE — Telephone Encounter (Signed)
Williams-Neal, Sade R routed conversation to You 22 minutes ago (1:43 PM)  Raoul Pitch, [place] R 23 minutes ago (1:42 PM)  SW   Patient stated she is now seeing doctors through [facility] due to insurance change no appt needed and she will follow up with them for refills

## 2019-11-15 ENCOUNTER — Other Ambulatory Visit: Payer: Self-pay | Admitting: Neurology

## 2019-12-16 ENCOUNTER — Other Ambulatory Visit: Payer: Self-pay | Admitting: Neurology

## 2019-12-16 NOTE — Telephone Encounter (Signed)
[name], Pamela R     1:42 PM Note   Patient stated she is now seeing doctors through [facility] due to insurance change no appt needed and she will follow up with them for refills

## 2020-03-19 ENCOUNTER — Other Ambulatory Visit: Payer: Self-pay | Admitting: Neurology

## 2020-04-12 ENCOUNTER — Other Ambulatory Visit: Payer: Self-pay | Admitting: Neurology

## 2020-09-17 DIAGNOSIS — Z1152 Encounter for screening for COVID-19: Secondary | ICD-10-CM | POA: Diagnosis not present

## 2020-10-18 DIAGNOSIS — G40909 Epilepsy, unspecified, not intractable, without status epilepticus: Secondary | ICD-10-CM | POA: Diagnosis not present

## 2020-10-18 DIAGNOSIS — Z8249 Family history of ischemic heart disease and other diseases of the circulatory system: Secondary | ICD-10-CM | POA: Diagnosis not present

## 2020-10-18 DIAGNOSIS — Z13220 Encounter for screening for lipoid disorders: Secondary | ICD-10-CM | POA: Diagnosis not present

## 2020-10-18 DIAGNOSIS — Z87891 Personal history of nicotine dependence: Secondary | ICD-10-CM | POA: Diagnosis not present

## 2020-10-18 DIAGNOSIS — Z0001 Encounter for general adult medical examination with abnormal findings: Secondary | ICD-10-CM | POA: Diagnosis not present

## 2020-10-18 DIAGNOSIS — Z79899 Other long term (current) drug therapy: Secondary | ICD-10-CM | POA: Diagnosis not present

## 2020-10-18 DIAGNOSIS — H538 Other visual disturbances: Secondary | ICD-10-CM | POA: Diagnosis not present

## 2020-10-18 DIAGNOSIS — R928 Other abnormal and inconclusive findings on diagnostic imaging of breast: Secondary | ICD-10-CM | POA: Diagnosis not present

## 2020-10-18 DIAGNOSIS — Z0000 Encounter for general adult medical examination without abnormal findings: Secondary | ICD-10-CM | POA: Diagnosis not present

## 2020-10-18 DIAGNOSIS — F439 Reaction to severe stress, unspecified: Secondary | ICD-10-CM | POA: Diagnosis not present

## 2020-10-18 DIAGNOSIS — Z1329 Encounter for screening for other suspected endocrine disorder: Secondary | ICD-10-CM | POA: Diagnosis not present

## 2020-10-18 DIAGNOSIS — R7303 Prediabetes: Secondary | ICD-10-CM | POA: Diagnosis not present

## 2020-10-18 DIAGNOSIS — R52 Pain, unspecified: Secondary | ICD-10-CM | POA: Diagnosis not present

## 2020-10-22 DIAGNOSIS — M1711 Unilateral primary osteoarthritis, right knee: Secondary | ICD-10-CM | POA: Diagnosis not present

## 2020-10-26 DIAGNOSIS — N6489 Other specified disorders of breast: Secondary | ICD-10-CM | POA: Diagnosis not present

## 2020-10-26 DIAGNOSIS — R928 Other abnormal and inconclusive findings on diagnostic imaging of breast: Secondary | ICD-10-CM | POA: Diagnosis not present

## 2020-10-26 DIAGNOSIS — R922 Inconclusive mammogram: Secondary | ICD-10-CM | POA: Diagnosis not present

## 2020-11-11 DIAGNOSIS — N6032 Fibrosclerosis of left breast: Secondary | ICD-10-CM | POA: Diagnosis not present

## 2020-11-11 DIAGNOSIS — R928 Other abnormal and inconclusive findings on diagnostic imaging of breast: Secondary | ICD-10-CM | POA: Diagnosis not present

## 2020-11-11 DIAGNOSIS — N6489 Other specified disorders of breast: Secondary | ICD-10-CM | POA: Diagnosis not present

## 2020-11-24 DIAGNOSIS — H538 Other visual disturbances: Secondary | ICD-10-CM | POA: Diagnosis not present

## 2020-11-24 DIAGNOSIS — H04123 Dry eye syndrome of bilateral lacrimal glands: Secondary | ICD-10-CM | POA: Diagnosis not present

## 2020-11-30 DIAGNOSIS — F329 Major depressive disorder, single episode, unspecified: Secondary | ICD-10-CM | POA: Diagnosis not present

## 2020-12-06 DIAGNOSIS — R928 Other abnormal and inconclusive findings on diagnostic imaging of breast: Secondary | ICD-10-CM | POA: Diagnosis not present

## 2020-12-13 DIAGNOSIS — R928 Other abnormal and inconclusive findings on diagnostic imaging of breast: Secondary | ICD-10-CM | POA: Diagnosis not present

## 2020-12-21 DIAGNOSIS — N6032 Fibrosclerosis of left breast: Secondary | ICD-10-CM | POA: Diagnosis not present

## 2020-12-21 DIAGNOSIS — R928 Other abnormal and inconclusive findings on diagnostic imaging of breast: Secondary | ICD-10-CM | POA: Diagnosis not present

## 2020-12-30 DIAGNOSIS — F32A Depression, unspecified: Secondary | ICD-10-CM | POA: Diagnosis not present

## 2020-12-30 DIAGNOSIS — G894 Chronic pain syndrome: Secondary | ICD-10-CM | POA: Diagnosis not present

## 2020-12-30 DIAGNOSIS — F419 Anxiety disorder, unspecified: Secondary | ICD-10-CM | POA: Diagnosis not present

## 2021-01-24 DIAGNOSIS — H52203 Unspecified astigmatism, bilateral: Secondary | ICD-10-CM | POA: Diagnosis not present

## 2021-01-24 DIAGNOSIS — H5213 Myopia, bilateral: Secondary | ICD-10-CM | POA: Diagnosis not present

## 2021-01-24 DIAGNOSIS — H524 Presbyopia: Secondary | ICD-10-CM | POA: Diagnosis not present

## 2021-02-02 DIAGNOSIS — J22 Unspecified acute lower respiratory infection: Secondary | ICD-10-CM | POA: Diagnosis not present

## 2021-03-16 DIAGNOSIS — M1711 Unilateral primary osteoarthritis, right knee: Secondary | ICD-10-CM | POA: Diagnosis not present

## 2021-04-19 DIAGNOSIS — G40909 Epilepsy, unspecified, not intractable, without status epilepticus: Secondary | ICD-10-CM | POA: Diagnosis not present

## 2021-04-19 DIAGNOSIS — R7303 Prediabetes: Secondary | ICD-10-CM | POA: Diagnosis not present

## 2021-04-19 DIAGNOSIS — E789 Disorder of lipoprotein metabolism, unspecified: Secondary | ICD-10-CM | POA: Diagnosis not present

## 2021-04-19 DIAGNOSIS — N951 Menopausal and female climacteric states: Secondary | ICD-10-CM | POA: Diagnosis not present

## 2021-05-16 DIAGNOSIS — M419 Scoliosis, unspecified: Secondary | ICD-10-CM | POA: Diagnosis not present

## 2021-05-16 DIAGNOSIS — M47816 Spondylosis without myelopathy or radiculopathy, lumbar region: Secondary | ICD-10-CM | POA: Diagnosis not present

## 2021-05-16 DIAGNOSIS — M1711 Unilateral primary osteoarthritis, right knee: Secondary | ICD-10-CM | POA: Diagnosis not present

## 2021-05-23 DIAGNOSIS — M1711 Unilateral primary osteoarthritis, right knee: Secondary | ICD-10-CM | POA: Diagnosis not present

## 2021-05-23 DIAGNOSIS — Z01810 Encounter for preprocedural cardiovascular examination: Secondary | ICD-10-CM | POA: Diagnosis not present

## 2021-05-27 DIAGNOSIS — M1711 Unilateral primary osteoarthritis, right knee: Secondary | ICD-10-CM | POA: Diagnosis not present

## 2021-06-23 DIAGNOSIS — Z96651 Presence of right artificial knee joint: Secondary | ICD-10-CM | POA: Diagnosis not present

## 2021-06-23 DIAGNOSIS — M25761 Osteophyte, right knee: Secondary | ICD-10-CM | POA: Diagnosis not present

## 2021-06-23 DIAGNOSIS — Z20822 Contact with and (suspected) exposure to covid-19: Secondary | ICD-10-CM | POA: Diagnosis not present

## 2021-06-23 DIAGNOSIS — Z9889 Other specified postprocedural states: Secondary | ICD-10-CM | POA: Diagnosis not present

## 2021-06-23 DIAGNOSIS — Z471 Aftercare following joint replacement surgery: Secondary | ICD-10-CM | POA: Diagnosis not present

## 2021-06-23 DIAGNOSIS — Z7409 Other reduced mobility: Secondary | ICD-10-CM | POA: Diagnosis not present

## 2021-06-23 DIAGNOSIS — G8918 Other acute postprocedural pain: Secondary | ICD-10-CM | POA: Diagnosis not present

## 2021-06-23 DIAGNOSIS — M1711 Unilateral primary osteoarthritis, right knee: Secondary | ICD-10-CM | POA: Diagnosis not present

## 2021-06-24 DIAGNOSIS — G4730 Sleep apnea, unspecified: Secondary | ICD-10-CM | POA: Diagnosis not present

## 2021-06-24 DIAGNOSIS — M48061 Spinal stenosis, lumbar region without neurogenic claudication: Secondary | ICD-10-CM | POA: Diagnosis not present

## 2021-06-24 DIAGNOSIS — M1711 Unilateral primary osteoarthritis, right knee: Secondary | ICD-10-CM | POA: Diagnosis not present

## 2021-06-24 DIAGNOSIS — M5412 Radiculopathy, cervical region: Secondary | ICD-10-CM | POA: Diagnosis not present

## 2021-06-24 DIAGNOSIS — G40909 Epilepsy, unspecified, not intractable, without status epilepticus: Secondary | ICD-10-CM | POA: Diagnosis not present

## 2021-06-24 DIAGNOSIS — J3089 Other allergic rhinitis: Secondary | ICD-10-CM | POA: Diagnosis not present

## 2021-06-24 DIAGNOSIS — Z471 Aftercare following joint replacement surgery: Secondary | ICD-10-CM | POA: Diagnosis not present

## 2021-06-24 DIAGNOSIS — K5900 Constipation, unspecified: Secondary | ICD-10-CM | POA: Diagnosis not present

## 2021-06-24 DIAGNOSIS — M75121 Complete rotator cuff tear or rupture of right shoulder, not specified as traumatic: Secondary | ICD-10-CM | POA: Diagnosis not present

## 2021-06-24 DIAGNOSIS — M1990 Unspecified osteoarthritis, unspecified site: Secondary | ICD-10-CM | POA: Diagnosis not present

## 2021-06-24 DIAGNOSIS — M5136 Other intervertebral disc degeneration, lumbar region: Secondary | ICD-10-CM | POA: Diagnosis not present

## 2021-06-24 DIAGNOSIS — Z7409 Other reduced mobility: Secondary | ICD-10-CM | POA: Diagnosis not present

## 2021-06-24 DIAGNOSIS — Z96651 Presence of right artificial knee joint: Secondary | ICD-10-CM | POA: Diagnosis not present

## 2021-06-27 DIAGNOSIS — K5900 Constipation, unspecified: Secondary | ICD-10-CM | POA: Diagnosis not present

## 2021-06-27 DIAGNOSIS — J3089 Other allergic rhinitis: Secondary | ICD-10-CM | POA: Diagnosis not present

## 2021-06-27 DIAGNOSIS — M5136 Other intervertebral disc degeneration, lumbar region: Secondary | ICD-10-CM | POA: Diagnosis not present

## 2021-06-27 DIAGNOSIS — Z7409 Other reduced mobility: Secondary | ICD-10-CM | POA: Diagnosis not present

## 2021-06-27 DIAGNOSIS — G40909 Epilepsy, unspecified, not intractable, without status epilepticus: Secondary | ICD-10-CM | POA: Diagnosis not present

## 2021-06-27 DIAGNOSIS — M1711 Unilateral primary osteoarthritis, right knee: Secondary | ICD-10-CM | POA: Diagnosis not present

## 2021-07-07 DIAGNOSIS — M5412 Radiculopathy, cervical region: Secondary | ICD-10-CM | POA: Diagnosis not present

## 2021-07-07 DIAGNOSIS — Z471 Aftercare following joint replacement surgery: Secondary | ICD-10-CM | POA: Diagnosis not present

## 2021-07-07 DIAGNOSIS — M48061 Spinal stenosis, lumbar region without neurogenic claudication: Secondary | ICD-10-CM | POA: Diagnosis not present

## 2021-07-07 DIAGNOSIS — M5136 Other intervertebral disc degeneration, lumbar region: Secondary | ICD-10-CM | POA: Diagnosis not present

## 2021-07-13 DIAGNOSIS — F419 Anxiety disorder, unspecified: Secondary | ICD-10-CM | POA: Diagnosis not present

## 2021-07-13 DIAGNOSIS — Z471 Aftercare following joint replacement surgery: Secondary | ICD-10-CM | POA: Diagnosis not present

## 2021-07-13 DIAGNOSIS — Z96651 Presence of right artificial knee joint: Secondary | ICD-10-CM | POA: Diagnosis not present

## 2021-07-13 DIAGNOSIS — M6281 Muscle weakness (generalized): Secondary | ICD-10-CM | POA: Diagnosis not present

## 2021-07-13 DIAGNOSIS — M5459 Other low back pain: Secondary | ICD-10-CM | POA: Diagnosis not present

## 2021-07-13 DIAGNOSIS — Z87891 Personal history of nicotine dependence: Secondary | ICD-10-CM | POA: Diagnosis not present

## 2021-07-13 DIAGNOSIS — R2681 Unsteadiness on feet: Secondary | ICD-10-CM | POA: Diagnosis not present

## 2021-07-14 DIAGNOSIS — M4125 Other idiopathic scoliosis, thoracolumbar region: Secondary | ICD-10-CM | POA: Diagnosis not present

## 2022-03-03 ENCOUNTER — Other Ambulatory Visit (HOSPITAL_COMMUNITY): Payer: Self-pay

## 2022-03-03 MED ORDER — PREGABALIN 50 MG PO CAPS
ORAL_CAPSULE | ORAL | 0 refills | Status: AC
  Filled 2022-03-03: qty 340, 85d supply, fill #0
  Filled 2022-03-03 – 2022-03-13 (×2): qty 120, 30d supply, fill #0

## 2022-03-06 ENCOUNTER — Other Ambulatory Visit (HOSPITAL_COMMUNITY): Payer: Self-pay

## 2022-03-13 ENCOUNTER — Other Ambulatory Visit (HOSPITAL_COMMUNITY): Payer: Self-pay

## 2022-04-18 ENCOUNTER — Other Ambulatory Visit (HOSPITAL_COMMUNITY): Payer: Self-pay

## 2022-05-29 ENCOUNTER — Other Ambulatory Visit (HOSPITAL_COMMUNITY): Payer: Self-pay

## 2022-06-09 ENCOUNTER — Other Ambulatory Visit (HOSPITAL_COMMUNITY): Payer: Self-pay

## 2022-06-09 MED ORDER — PREGABALIN 50 MG PO CAPS
ORAL_CAPSULE | ORAL | 0 refills | Status: AC
  Filled 2022-06-09: qty 340, 85d supply, fill #0

## 2022-06-12 ENCOUNTER — Other Ambulatory Visit (HOSPITAL_COMMUNITY): Payer: Self-pay

## 2022-06-22 ENCOUNTER — Other Ambulatory Visit (HOSPITAL_COMMUNITY): Payer: Self-pay

## 2022-06-22 MED ORDER — VENLAFAXINE HCL ER 150 MG PO CP24
150.0000 mg | ORAL_CAPSULE | Freq: Every day | ORAL | 0 refills | Status: AC
  Filled 2022-06-22: qty 90, 90d supply, fill #0

## 2022-06-23 ENCOUNTER — Other Ambulatory Visit (HOSPITAL_COMMUNITY): Payer: Self-pay

## 2022-07-20 ENCOUNTER — Other Ambulatory Visit (HOSPITAL_COMMUNITY): Payer: Self-pay

## 2022-07-20 MED ORDER — LEVETIRACETAM 500 MG PO TABS
500.0000 mg | ORAL_TABLET | Freq: Three times a day (TID) | ORAL | 3 refills | Status: AC
  Filled 2022-07-20: qty 270, 90d supply, fill #0
  Filled 2022-09-26: qty 270, 90d supply, fill #1
  Filled 2023-05-21: qty 270, 90d supply, fill #2

## 2022-09-16 ENCOUNTER — Other Ambulatory Visit (HOSPITAL_COMMUNITY): Payer: Self-pay

## 2022-09-26 ENCOUNTER — Other Ambulatory Visit (HOSPITAL_COMMUNITY): Payer: Self-pay

## 2022-10-05 ENCOUNTER — Other Ambulatory Visit (HOSPITAL_COMMUNITY): Payer: Self-pay

## 2022-10-25 ENCOUNTER — Other Ambulatory Visit (HOSPITAL_COMMUNITY): Payer: Self-pay

## 2022-10-25 MED ORDER — PREGABALIN 50 MG PO CAPS
50.0000 mg | ORAL_CAPSULE | ORAL | 0 refills | Status: DC
  Filled 2022-10-25: qty 360, 90d supply, fill #0

## 2022-10-25 MED ORDER — LEVETIRACETAM 500 MG PO TABS
500.0000 mg | ORAL_TABLET | Freq: Three times a day (TID) | ORAL | 3 refills | Status: DC
  Filled 2022-10-25 – 2023-02-26 (×2): qty 270, 90d supply, fill #0
  Filled 2023-08-20 – 2023-09-12 (×3): qty 270, 90d supply, fill #1

## 2022-10-25 MED ORDER — VENLAFAXINE HCL ER 150 MG PO CP24
150.0000 mg | ORAL_CAPSULE | Freq: Every day | ORAL | 2 refills | Status: AC
  Filled 2022-10-25 – 2023-04-21 (×3): qty 90, 90d supply, fill #0
  Filled 2023-07-18 – 2023-07-21 (×2): qty 90, 90d supply, fill #1
  Filled 2023-10-18: qty 90, 90d supply, fill #2

## 2022-10-25 MED ORDER — PANTOPRAZOLE SODIUM 40 MG PO TBEC
40.0000 mg | DELAYED_RELEASE_TABLET | Freq: Every day | ORAL | 0 refills | Status: DC
  Filled 2022-10-25: qty 14, 14d supply, fill #0

## 2022-10-25 MED ORDER — ZOSTER VAC RECOMB ADJUVANTED 50 MCG/0.5ML IM SUSR
INTRAMUSCULAR | 0 refills | Status: AC
  Filled 2022-10-25: qty 1, 1d supply, fill #0

## 2022-10-26 ENCOUNTER — Other Ambulatory Visit (HOSPITAL_COMMUNITY): Payer: Self-pay

## 2022-11-07 ENCOUNTER — Other Ambulatory Visit (HOSPITAL_COMMUNITY): Payer: Self-pay

## 2022-11-07 MED ORDER — PANTOPRAZOLE SODIUM 40 MG PO TBEC
40.0000 mg | DELAYED_RELEASE_TABLET | Freq: Every day | ORAL | 1 refills | Status: DC
  Filled 2022-11-07: qty 90, 90d supply, fill #0
  Filled 2023-02-07: qty 90, 90d supply, fill #1

## 2022-11-30 ENCOUNTER — Other Ambulatory Visit (HOSPITAL_COMMUNITY): Payer: Self-pay

## 2022-12-04 ENCOUNTER — Other Ambulatory Visit (HOSPITAL_COMMUNITY): Payer: Self-pay

## 2022-12-04 MED ORDER — OLOPATADINE HCL 0.2 % OP SOLN
1.0000 [drp] | Freq: Every day | OPHTHALMIC | 2 refills | Status: AC
  Filled 2022-12-04: qty 5, 50d supply, fill #0

## 2023-01-12 ENCOUNTER — Other Ambulatory Visit (HOSPITAL_COMMUNITY): Payer: Self-pay

## 2023-01-19 ENCOUNTER — Other Ambulatory Visit (HOSPITAL_COMMUNITY): Payer: Self-pay

## 2023-01-19 MED ORDER — VENLAFAXINE HCL ER 150 MG PO CP24
150.0000 mg | ORAL_CAPSULE | Freq: Every day | ORAL | 0 refills | Status: DC
  Filled 2023-01-19 (×2): qty 90, 90d supply, fill #0

## 2023-01-20 ENCOUNTER — Other Ambulatory Visit (HOSPITAL_COMMUNITY): Payer: Self-pay

## 2023-02-08 ENCOUNTER — Other Ambulatory Visit (HOSPITAL_COMMUNITY): Payer: Self-pay

## 2023-02-26 ENCOUNTER — Other Ambulatory Visit (HOSPITAL_COMMUNITY): Payer: Self-pay

## 2023-02-28 ENCOUNTER — Other Ambulatory Visit (HOSPITAL_COMMUNITY): Payer: Self-pay

## 2023-04-04 ENCOUNTER — Other Ambulatory Visit (HOSPITAL_COMMUNITY): Payer: Self-pay

## 2023-04-19 ENCOUNTER — Other Ambulatory Visit (HOSPITAL_COMMUNITY): Payer: Self-pay

## 2023-04-20 ENCOUNTER — Other Ambulatory Visit (HOSPITAL_COMMUNITY): Payer: Self-pay

## 2023-04-20 MED ORDER — PREGABALIN 50 MG PO CAPS
ORAL_CAPSULE | ORAL | 2 refills | Status: DC
  Filled 2023-04-20: qty 90, 30d supply, fill #0
  Filled 2023-05-14 – 2023-05-18 (×2): qty 90, 30d supply, fill #1
  Filled 2023-06-12 – 2023-06-15 (×2): qty 90, 30d supply, fill #2

## 2023-04-21 ENCOUNTER — Other Ambulatory Visit (HOSPITAL_COMMUNITY): Payer: Self-pay

## 2023-04-26 ENCOUNTER — Other Ambulatory Visit (HOSPITAL_COMMUNITY): Payer: Self-pay

## 2023-05-02 ENCOUNTER — Other Ambulatory Visit (HOSPITAL_COMMUNITY): Payer: Self-pay

## 2023-05-02 MED ORDER — PANTOPRAZOLE SODIUM 40 MG PO TBEC
40.0000 mg | DELAYED_RELEASE_TABLET | Freq: Every day | ORAL | 1 refills | Status: DC
  Filled 2023-05-02: qty 90, 90d supply, fill #0
  Filled 2023-07-31: qty 90, 90d supply, fill #1

## 2023-05-04 ENCOUNTER — Other Ambulatory Visit (HOSPITAL_COMMUNITY): Payer: Self-pay

## 2023-05-05 ENCOUNTER — Other Ambulatory Visit (HOSPITAL_COMMUNITY): Payer: Self-pay

## 2023-05-09 ENCOUNTER — Other Ambulatory Visit (HOSPITAL_COMMUNITY): Payer: Self-pay

## 2023-05-14 ENCOUNTER — Other Ambulatory Visit: Payer: Self-pay

## 2023-05-14 ENCOUNTER — Other Ambulatory Visit (HOSPITAL_COMMUNITY): Payer: Self-pay

## 2023-05-18 ENCOUNTER — Other Ambulatory Visit: Payer: Self-pay

## 2023-05-24 ENCOUNTER — Other Ambulatory Visit (HOSPITAL_COMMUNITY): Payer: Self-pay

## 2023-06-12 ENCOUNTER — Other Ambulatory Visit (HOSPITAL_COMMUNITY): Payer: Self-pay

## 2023-06-12 ENCOUNTER — Other Ambulatory Visit: Payer: Self-pay

## 2023-06-15 ENCOUNTER — Other Ambulatory Visit: Payer: Self-pay

## 2023-06-18 ENCOUNTER — Other Ambulatory Visit (HOSPITAL_COMMUNITY): Payer: Self-pay

## 2023-06-28 ENCOUNTER — Other Ambulatory Visit (HOSPITAL_COMMUNITY): Payer: Self-pay

## 2023-07-12 ENCOUNTER — Other Ambulatory Visit (HOSPITAL_COMMUNITY): Payer: Self-pay

## 2023-07-12 MED ORDER — PREGABALIN 50 MG PO CAPS
ORAL_CAPSULE | ORAL | 2 refills | Status: DC
Start: 2023-07-12 — End: 2024-01-04
  Filled 2023-07-12 – 2023-09-11 (×2): qty 90, 23d supply, fill #0
  Filled 2023-11-02: qty 90, 23d supply, fill #1

## 2023-07-20 ENCOUNTER — Other Ambulatory Visit (HOSPITAL_COMMUNITY): Payer: Self-pay

## 2023-07-27 ENCOUNTER — Other Ambulatory Visit (HOSPITAL_COMMUNITY): Payer: Self-pay

## 2023-07-27 MED ORDER — ESTRADIOL 0.1 MG/24HR TD PTWK
0.1000 mg | MEDICATED_PATCH | TRANSDERMAL | 11 refills | Status: DC
  Filled 2023-07-27: qty 4, 28d supply, fill #0
  Filled 2023-08-17: qty 4, 28d supply, fill #1
  Filled 2023-09-14: qty 4, 28d supply, fill #2
  Filled 2023-10-12: qty 4, 28d supply, fill #3
  Filled 2023-11-08: qty 4, 28d supply, fill #4
  Filled 2023-12-05: qty 4, 28d supply, fill #5
  Filled 2024-01-04: qty 4, 28d supply, fill #6
  Filled 2024-02-02 – 2024-02-22 (×2): qty 4, 28d supply, fill #7
  Filled 2024-03-14 – 2024-04-05 (×3): qty 4, 28d supply, fill #8
  Filled 2024-05-02 – 2024-05-31 (×4): qty 4, 28d supply, fill #9
  Filled 2024-06-28 – 2024-07-20 (×3): qty 4, 28d supply, fill #10

## 2023-07-27 MED ORDER — AZITHROMYCIN 250 MG PO TABS
ORAL_TABLET | ORAL | 0 refills | Status: AC
  Filled 2023-07-27: qty 6, 5d supply, fill #0

## 2023-08-01 ENCOUNTER — Other Ambulatory Visit (HOSPITAL_COMMUNITY): Payer: Self-pay

## 2023-08-17 ENCOUNTER — Other Ambulatory Visit: Payer: Self-pay

## 2023-08-23 ENCOUNTER — Other Ambulatory Visit (HOSPITAL_COMMUNITY): Payer: Self-pay

## 2023-09-11 ENCOUNTER — Other Ambulatory Visit (HOSPITAL_COMMUNITY): Payer: Self-pay

## 2023-10-17 ENCOUNTER — Other Ambulatory Visit (HOSPITAL_COMMUNITY): Payer: Self-pay

## 2023-10-24 ENCOUNTER — Other Ambulatory Visit (HOSPITAL_COMMUNITY): Payer: Self-pay

## 2023-10-24 MED ORDER — MELOXICAM 7.5 MG PO TABS
7.5000 mg | ORAL_TABLET | Freq: Every day | ORAL | 0 refills | Status: AC
  Filled 2023-10-24: qty 30, 30d supply, fill #0

## 2023-10-29 ENCOUNTER — Other Ambulatory Visit (HOSPITAL_COMMUNITY): Payer: Self-pay

## 2023-10-29 MED ORDER — PANTOPRAZOLE SODIUM 40 MG PO TBEC
40.0000 mg | DELAYED_RELEASE_TABLET | Freq: Every day | ORAL | 1 refills | Status: DC
  Filled 2023-10-29: qty 90, 90d supply, fill #0
  Filled 2024-01-28: qty 90, 90d supply, fill #1

## 2023-11-05 ENCOUNTER — Other Ambulatory Visit (HOSPITAL_COMMUNITY): Payer: Self-pay

## 2023-12-05 ENCOUNTER — Other Ambulatory Visit: Payer: Self-pay

## 2023-12-11 ENCOUNTER — Other Ambulatory Visit (HOSPITAL_COMMUNITY): Payer: Self-pay

## 2023-12-11 MED ORDER — LEVETIRACETAM 500 MG PO TABS
500.0000 mg | ORAL_TABLET | Freq: Three times a day (TID) | ORAL | 3 refills | Status: AC
  Filled 2023-12-11 – 2024-01-25 (×5): qty 270, 90d supply, fill #0
  Filled 2024-04-28 – 2024-05-20 (×3): qty 270, 90d supply, fill #1
  Filled 2024-08-11 – 2024-09-19 (×4): qty 270, 90d supply, fill #2

## 2023-12-12 ENCOUNTER — Other Ambulatory Visit (HOSPITAL_COMMUNITY): Payer: Self-pay

## 2023-12-14 ENCOUNTER — Other Ambulatory Visit (HOSPITAL_COMMUNITY): Payer: Self-pay

## 2023-12-15 ENCOUNTER — Other Ambulatory Visit (HOSPITAL_COMMUNITY): Payer: Self-pay

## 2023-12-25 ENCOUNTER — Other Ambulatory Visit (HOSPITAL_COMMUNITY): Payer: Self-pay

## 2023-12-26 ENCOUNTER — Other Ambulatory Visit (HOSPITAL_COMMUNITY): Payer: Self-pay

## 2024-01-04 ENCOUNTER — Other Ambulatory Visit (HOSPITAL_COMMUNITY): Payer: Self-pay

## 2024-01-04 MED ORDER — PREGABALIN 75 MG PO CAPS
75.0000 mg | ORAL_CAPSULE | Freq: Three times a day (TID) | ORAL | 0 refills | Status: DC
  Filled 2024-01-04: qty 90, 30d supply, fill #0

## 2024-01-10 ENCOUNTER — Other Ambulatory Visit (HOSPITAL_COMMUNITY): Payer: Self-pay

## 2024-01-11 ENCOUNTER — Other Ambulatory Visit (HOSPITAL_COMMUNITY): Payer: Self-pay

## 2024-01-12 ENCOUNTER — Other Ambulatory Visit (HOSPITAL_COMMUNITY): Payer: Self-pay

## 2024-01-14 ENCOUNTER — Other Ambulatory Visit (HOSPITAL_COMMUNITY): Payer: Self-pay

## 2024-01-16 ENCOUNTER — Other Ambulatory Visit (HOSPITAL_COMMUNITY): Payer: Self-pay

## 2024-01-16 MED ORDER — VENLAFAXINE HCL ER 150 MG PO CP24
150.0000 mg | ORAL_CAPSULE | Freq: Every day | ORAL | 0 refills | Status: DC
  Filled 2024-01-16 – 2024-01-25 (×2): qty 90, 90d supply, fill #0

## 2024-01-24 ENCOUNTER — Other Ambulatory Visit (HOSPITAL_COMMUNITY): Payer: Self-pay

## 2024-01-25 ENCOUNTER — Other Ambulatory Visit: Payer: Self-pay

## 2024-01-25 ENCOUNTER — Other Ambulatory Visit (HOSPITAL_COMMUNITY): Payer: Self-pay

## 2024-02-04 ENCOUNTER — Other Ambulatory Visit (HOSPITAL_COMMUNITY): Payer: Self-pay

## 2024-02-04 MED ORDER — PREGABALIN 75 MG PO CAPS
75.0000 mg | ORAL_CAPSULE | Freq: Three times a day (TID) | ORAL | 2 refills | Status: DC
  Filled 2024-02-04 – 2024-02-22 (×3): qty 90, 30d supply, fill #0
  Filled 2024-04-28: qty 90, 30d supply, fill #1
  Filled 2024-06-25: qty 90, 30d supply, fill #2

## 2024-02-05 ENCOUNTER — Other Ambulatory Visit (HOSPITAL_COMMUNITY): Payer: Self-pay

## 2024-02-09 ENCOUNTER — Other Ambulatory Visit (HOSPITAL_COMMUNITY): Payer: Self-pay

## 2024-02-21 ENCOUNTER — Other Ambulatory Visit (HOSPITAL_COMMUNITY): Payer: Self-pay

## 2024-02-22 ENCOUNTER — Other Ambulatory Visit (HOSPITAL_COMMUNITY): Payer: Self-pay

## 2024-03-24 ENCOUNTER — Other Ambulatory Visit (HOSPITAL_COMMUNITY): Payer: Self-pay

## 2024-04-04 ENCOUNTER — Other Ambulatory Visit (HOSPITAL_COMMUNITY): Payer: Self-pay

## 2024-04-28 ENCOUNTER — Other Ambulatory Visit (HOSPITAL_COMMUNITY): Payer: Self-pay

## 2024-04-28 ENCOUNTER — Other Ambulatory Visit: Payer: Self-pay

## 2024-04-28 MED ORDER — VENLAFAXINE HCL ER 150 MG PO CP24
150.0000 mg | ORAL_CAPSULE | Freq: Every day | ORAL | 0 refills | Status: DC
  Filled 2024-04-28 – 2024-05-20 (×3): qty 90, 90d supply, fill #0

## 2024-04-28 MED ORDER — PANTOPRAZOLE SODIUM 40 MG PO TBEC
40.0000 mg | DELAYED_RELEASE_TABLET | Freq: Every day | ORAL | 1 refills | Status: AC
  Filled 2024-04-28: qty 90, 90d supply, fill #0
  Filled 2024-07-27: qty 90, 90d supply, fill #1

## 2024-05-01 NOTE — Progress Notes (Signed)
 [facility]  - Family Medicine Myra Master  Date of Service: 05/01/2024 Patient Name: Pamela [name] Patient DOB: 27-Nov-1960   Subjective:   Patient comes in for Prediabetes Follow Up   HPI Prediabetes  Patient presents for prediabetes. Current symptoms include: none. Patient denies any symptoms. Evaluation to date has included: hemoglobin A1C.  Current treatment: none. Patient does exercise at least 3-4 times a week. Current diet: well balanced.    Fatigue Patient states she's experiencing extreme fatigue. Patient states she's no longer taking a multivitamin but taking a prenatal vitamin to help increase her appetite. Patient is currently trying to gain weight. Patient states she still doesn't have much of an appetite. Patient would like to have her B12 and vitamin D levels checked. Lab Results  Component Value Date   HGBA1C 5.6 11/02/2023     Wt Readings from Last 3 Encounters:  05/01/24 75.3 kg (165 lb 14.4 oz)  11/02/23 78.2 kg (172 lb 6.4 oz)  10/24/23 79.1 kg (174 lb 6.4 oz)     Review of Systems Review of Systems  Constitutional:  Positive for fatigue.  Respiratory:  Negative for cough and shortness of breath.   Cardiovascular:  Negative for chest pain.  Gastrointestinal:  Negative for abdominal pain.  Musculoskeletal:  Negative for back pain.  Neurological:  Negative for headaches.     Social History[1]    The following portions of the patient's history were reviewed and updated as appropriate: allergies, current medications, past family history, past medical history, past surgical history and problem list.  Objective:   Vital Signs BP 100/62 (BP Location: Left arm, Patient Position: Sitting)   Pulse 59   Temp 97.3 F (36.3 C) (Temporal)   Ht 1.676 m (5' 6)   Wt 75.3 kg (165 lb 14.4 oz)   SpO2 97%   BMI 26.78 kg/m    General: Healthy appearing 63 y.o. female, A&O x 4. Lungs.  Clear bilaterally, breath sounds  equal, respirations unlabored. Cardiovascular.  Regular, nl S1, S2; no murmurs, gallops or rubs.   GI: Abdomen soft non-tender  Assessment/Plan:    Lorrinda was seen today for prediabetes follow up.  Diagnoses and all orders for this visit:  Prediabetes -     Hemoglobin A1C With Estimated Average Glucose  Fatigue, unspecified type -     Vitamin B12 -     Vitamin D, 25-Hydroxy -     TSH -     Sedimentation Rate (ESR) -     C-Reactive Protein (CRP) -     HIV Screen with Reflex to Confirmation -     Acute Hepatitis Screening Panel With Confirmation  Unintentional weight loss -     TSH -     Comprehensive Metabolic Panel -     Anemia Profile -     CBC with Differential -     Cancer Antigen (CA) 125 -     Cancel: POC Hemoccult Diagnostic (Guaiac) -     Sedimentation Rate (ESR) -     C-Reactive Protein (CRP) -     HIV Screen with Reflex to Confirmation -     Acute Hepatitis Screening Panel With Confirmation -     POC Hemoccult Screen ([place])   -Full workup due to unintentional weight loss. Discussed if labs return negative, then she should really decrease how much she vapes to see if this helps with her appetite.  -Diet low in refined carbohydrates (bread/rice/pasta/potatoes) and sugars (sweets, sugary  beverages) as well as unhealthy fats (fried foods, many of the processed/prepackaged snack foods) and regular exercise recommended. - A1C <6.4    Patient verbalizes understanding and in agreement with the above plan. All questions answered.    Medication side effects discussed with patient. Advised patient to call clinic or return for visit if these symptoms occur.   Goals of care discussed with patient including med compliance and adequate follow up.  Return in about 6 months (around 11/01/2024) for CPE.    This document was created using the aid of voice recognition Scientist, clinical (histocompatibility and immunogenetics).   Tari ONEIDA Banter, PA-C   This document serves as a record of services  personally performed by Tari Banter PA-C.  It was created on their behalf by Michae JONETTA Kerns, CMA, a trained medical scribe, and Certified Medical Assistant (CMA). During the course of documenting the history, physical exam and medical decision making, I was functioning as a Stage manager. The creation of this record is the provider's dictation and/or activities during the visit.  Electronically signed by Michae JONETTA Kerns, CMA 05/01/2024 1:26 PM   I agree the documentation is accurate and complete.  Electronically signed by: Tari ONEIDA Banter, PA-C 05/01/2024 2:28 PM         [1] Social History Socioeconomic History  . Marital status: Single  Tobacco Use  . Smoking status: Former    Current packs/day: 0.00    Types: Cigarettes    Quit date: 09/04/2018    Years since quitting: 5.6  . Smokeless tobacco: Former  Substance and Sexual Activity  . Alcohol use: No   Social Drivers of Health   Food Insecurity: Low Risk  (04/27/2023)   Food vital sign   . Within the past 12 months, you worried that your food would run out before you got money to buy more: Never true   . Within the past 12 months, the food you bought just didn't last and you didn't have money to get more: Never true  Transportation Needs: No Transportation Needs (04/27/2023)   Transportation   . In the past 12 months, has lack of reliable transportation kept you from medical appointments, meetings, work or from getting things needed for daily living? : No  Safety: Low Risk  (04/27/2023)   Safety   . How often does anyone, including family and friends, physically hurt you?: Never   . How often does anyone, including family and friends, insult or talk down to you?: Never   . How often does anyone, including family and friends, threaten you with harm?: Never   . How often does anyone, including family and friends, scream or curse at you?: Never  Living Situation: Low Risk  (04/27/2023)   Living Situation   . What is your living  situation today?: I have a steady place to live   . Think about the place you live. Do you have problems with any of the following? Choose all that apply:: None/None on this list

## 2024-05-07 NOTE — Unmapped External Note (Signed)
 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 Stiffness in joint      2. Arthritis of elbow, right      3. Muscle weakness (generalized)       Rehabilitation Precautions/Restrictions:   Precautions/Restrictions Precautions: OA, anxiety, DDD     Initial Evaluation Date:  03/27/2024 Reporting Period:  03/27/2024 to 05/07/2024 Number of Visits to Date: Visit Count: 7 Number of Missed Visits: 0   SUBJECTIVE Reports that she has been feeling better. Reports that she feels like she's ready to be done with physical therapy. Reports that she feels like her pain is not as intense as it used to be. Reports that she had an active weekend with decreased symptoms/pain Current Occupation:  Current: disability  Pain:  Pain Assessment Pain Assessment: 0-10 Pain Score  : 0   OBJECTIVE  General Observation:   Ambulating independently non antalgic gait Test and Measures:    145 degrees elbow flexion, 0 degrees extension RUE 5/5 shoulder flexion/abduction, elbow flexion/extension    Outcome Measure: PT Outcome Measures    Flowsheet Row Value  Quick Dash-Activities   Open a tight or new jar 3  Do heavy household chores (wash walls, wash floors) 4  Carry a shopping bag or briefcase 3  Wash your back 3  Use knife to cut food 3  Recreational activities in which you take some force or impact through your arm, shoulder or hand (golf, hammering, tennis) 5  During the past week, to what extent has your arm, shoulder or hand roblem interfered with your normal social activities with family, friends, neighbors, or groups? 3  During the past week, to what extent were you limited in your work or other regular daily activities as  a result of your arm, shoulder, or hand problem 2  Arm, shoulder, or hand pain 2  Tingling (pins and needles) in your arm, shoulder, or hand 2  Duting the past week, how much difficulty have you had sleeping because of the pain in your arm, shoulder or hand? 2  Quick DASH Score 47.73         Interventions:   Review HEP Therapeutic exercise x 38' Reviewed Ice massage to R ECRB Arm bike x 6' for ROM/warm up Eccentric wrist extension with 3 lbs x 30  Eccentric wrist flexion with 3 lbs x 30 Rows x 30 CS yellow tband Multifidus press out x 30 BUE yellow tband Red Therabar bending x 30 hold 5 Red therabar twisting x 30 hold 5   Modalities x 18' Moist heat to R ECRB x 10' at end of today's visit   Ultrasound 3.3 MgHz 1.2 Watts cm2 x 100% intensity to R ECRB x 8'  Modalities Skin Integrity Assessment:   Modalities Performed per interventions above.  Patient was informed of risks and benefits to treatment today.  Skin integrity prior to treatment:   Intact.  Skin integrity after treatment:   Intact.   Education:  Yes, as described in interventions   ASSESSMENT  Plan to discharge patient at this time. Patient with improvement in strength/endurance, R elbow ROM, and decresaed pain. Patient with improvement in functional mobility. Patient with independence with therapeutic exercise and Hep at this time. Patient with  good progress towards goals. Patient with continued difficulty with lifting and exerting force with the RUE.  Patient tolerated treatment well.  No issues at end of session. No increase in pain during session. Patient and therapist in agreement with discharge plan at this time.   Therapy Program to Date: In summary, the therapy program has included: Patient and/or Caregiver Education, Neuromuscular Re-Ed, Therapeutic Exercises, Therapeutic Activities, Manual Therapy, and Modalities  Progress Towards Goals:     Goals Addressed             This Visit's Progress   . PT  Goal       Short Term Goals (STG) - Time Frame 8/24  STG 1: pt to report compliance with HEP  STG 2: pt to report no more than 1/10 pain with functional activities  Progressing 9/3 Long Term Goals (LTG) - Time Frame 10/19  LTG 1: pt to report no more than 5% disability with DASH to faciltiate return to prior functional mobility  47% 9/3  LTG 2: pt to demonstrate R elbow AROM equal to that of L elbow without pain to facilitate improvement in functional mobility progressing  LTG 3: pt to return to crocheting without difficulty  progressing  LTG4: pt to demonstrate 5/5 BUE MMT pain free to facilitate return to prior functional mobility   5/5 9/3            Progress Summary:  Improvement in strength/endurance RUE Improvement in R elbow ROM Decreased pain Improvement in mobility Compliance with HEP Compliance with attendance to skilled PT services  PLAN  Recommendations:  Physical Therapy services are discontinued at this time secondary to:  Goals have been achieved. and Patient is independent with home exercise program.  Development of Plan of Care:  The discharge plan was discussed and agreed upon by the patient and/or family.  Total Treatment Time (Time & Untimed): Total Treatment Time: 46 Total Time in Timed Codes: Time in Timed Codes: 38   Modalities Ultrasound Therapy minutes: 8 Treatment/Procedures Therapeutic Exercises minutes: 38

## 2024-05-08 ENCOUNTER — Other Ambulatory Visit (HOSPITAL_COMMUNITY): Payer: Self-pay

## 2024-05-09 ENCOUNTER — Other Ambulatory Visit (HOSPITAL_COMMUNITY): Payer: Self-pay

## 2024-05-11 ENCOUNTER — Other Ambulatory Visit (HOSPITAL_COMMUNITY): Payer: Self-pay

## 2024-05-12 NOTE — Progress Notes (Signed)
 Patient presented to the office to drop off iFOB kit. Kit was tested in the office at today's visit. Patient and provider have been informed of results.

## 2024-05-19 ENCOUNTER — Other Ambulatory Visit (HOSPITAL_COMMUNITY): Payer: Self-pay

## 2024-05-20 ENCOUNTER — Other Ambulatory Visit: Payer: Self-pay

## 2024-05-20 ENCOUNTER — Other Ambulatory Visit (HOSPITAL_COMMUNITY): Payer: Self-pay

## 2024-05-30 ENCOUNTER — Other Ambulatory Visit (HOSPITAL_COMMUNITY): Payer: Self-pay

## 2024-06-07 ENCOUNTER — Other Ambulatory Visit (HOSPITAL_COMMUNITY): Payer: Self-pay

## 2024-06-25 ENCOUNTER — Other Ambulatory Visit (HOSPITAL_COMMUNITY): Payer: Self-pay

## 2024-06-25 ENCOUNTER — Other Ambulatory Visit: Payer: Self-pay

## 2024-06-26 ENCOUNTER — Other Ambulatory Visit: Payer: Self-pay

## 2024-06-26 ENCOUNTER — Other Ambulatory Visit (HOSPITAL_COMMUNITY): Payer: Self-pay

## 2024-06-26 MED ORDER — PANTOPRAZOLE SODIUM 40 MG PO TBEC
40.0000 mg | DELAYED_RELEASE_TABLET | Freq: Every day | ORAL | 2 refills | Status: AC
  Filled 2024-06-26: qty 90, 90d supply, fill #0

## 2024-06-26 MED ORDER — PREGABALIN 75 MG PO CAPS
75.0000 mg | ORAL_CAPSULE | Freq: Three times a day (TID) | ORAL | 2 refills | Status: AC
  Filled 2024-06-26 – 2024-09-01 (×2): qty 90, 30d supply, fill #0

## 2024-07-08 ENCOUNTER — Other Ambulatory Visit (HOSPITAL_COMMUNITY): Payer: Self-pay

## 2024-07-19 ENCOUNTER — Other Ambulatory Visit (HOSPITAL_COMMUNITY): Payer: Self-pay

## 2024-08-01 ENCOUNTER — Other Ambulatory Visit (HOSPITAL_COMMUNITY): Payer: Self-pay

## 2024-08-02 ENCOUNTER — Other Ambulatory Visit (HOSPITAL_COMMUNITY): Payer: Self-pay

## 2024-08-11 ENCOUNTER — Other Ambulatory Visit (HOSPITAL_COMMUNITY): Payer: Self-pay

## 2024-08-11 ENCOUNTER — Other Ambulatory Visit: Payer: Self-pay

## 2024-08-11 MED ORDER — VENLAFAXINE HCL ER 150 MG PO CP24
150.0000 mg | ORAL_CAPSULE | Freq: Every day | ORAL | 0 refills | Status: AC
  Filled 2024-08-11 – 2024-08-26 (×2): qty 90, 90d supply, fill #0

## 2024-08-21 ENCOUNTER — Other Ambulatory Visit (HOSPITAL_COMMUNITY): Payer: Self-pay

## 2024-08-21 MED ORDER — METHYLPREDNISOLONE 4 MG PO TBPK
ORAL_TABLET | ORAL | 0 refills | Status: AC
  Filled 2024-08-21: qty 21, 6d supply, fill #0

## 2024-08-22 ENCOUNTER — Other Ambulatory Visit: Payer: Self-pay

## 2024-08-24 ENCOUNTER — Other Ambulatory Visit (HOSPITAL_COMMUNITY): Payer: Self-pay

## 2024-08-24 MED ORDER — ESTRADIOL 0.1 MG/24HR TD PTWK
0.1000 mg | MEDICATED_PATCH | TRANSDERMAL | 11 refills | Status: AC
  Filled 2024-08-24 – 2024-09-19 (×3): qty 4, 28d supply, fill #0

## 2024-08-26 ENCOUNTER — Other Ambulatory Visit (HOSPITAL_COMMUNITY): Payer: Self-pay

## 2024-09-01 ENCOUNTER — Other Ambulatory Visit (HOSPITAL_COMMUNITY): Payer: Self-pay

## 2024-09-02 ENCOUNTER — Other Ambulatory Visit (HOSPITAL_COMMUNITY): Payer: Self-pay

## 2024-09-18 ENCOUNTER — Other Ambulatory Visit (HOSPITAL_COMMUNITY): Payer: Self-pay

## 2024-09-19 ENCOUNTER — Other Ambulatory Visit (HOSPITAL_COMMUNITY): Payer: Self-pay

## 2024-09-19 ENCOUNTER — Other Ambulatory Visit: Payer: Self-pay

## 2024-09-25 ENCOUNTER — Other Ambulatory Visit (HOSPITAL_COMMUNITY): Payer: Self-pay

## 2024-09-26 ENCOUNTER — Other Ambulatory Visit (HOSPITAL_COMMUNITY): Payer: Self-pay

## 2024-10-10 ENCOUNTER — Other Ambulatory Visit (HOSPITAL_COMMUNITY): Payer: Self-pay
